# Patient Record
Sex: Male | Born: 1965 | Race: Black or African American | Hispanic: No | Marital: Married | Smoking: Current every day smoker
Health system: Southern US, Community
[De-identification: ages and names within clinical notes are randomized; demographics above are authoritative.]

## PROBLEM LIST (undated history)

## (undated) DIAGNOSIS — E041 Nontoxic single thyroid nodule: Secondary | ICD-10-CM

## (undated) DIAGNOSIS — K219 Gastro-esophageal reflux disease without esophagitis: Secondary | ICD-10-CM

## (undated) HISTORY — PX: CIRCUMCISION: SUR203

## (undated) HISTORY — PX: ESOPHAGOGASTRODUODENOSCOPY: SHX1529

---

## 2008-12-29 ENCOUNTER — Emergency Department (HOSPITAL_BASED_OUTPATIENT_CLINIC_OR_DEPARTMENT_OTHER): Admission: EM | Admit: 2008-12-29 | Discharge: 2008-12-29 | Payer: Self-pay | Admitting: Emergency Medicine

## 2009-05-19 ENCOUNTER — Emergency Department (HOSPITAL_BASED_OUTPATIENT_CLINIC_OR_DEPARTMENT_OTHER): Admission: EM | Admit: 2009-05-19 | Discharge: 2009-05-19 | Payer: Self-pay | Admitting: Emergency Medicine

## 2009-10-21 ENCOUNTER — Emergency Department (HOSPITAL_BASED_OUTPATIENT_CLINIC_OR_DEPARTMENT_OTHER): Admission: EM | Admit: 2009-10-21 | Discharge: 2009-10-21 | Payer: Self-pay | Admitting: Emergency Medicine

## 2010-03-18 ENCOUNTER — Emergency Department (HOSPITAL_BASED_OUTPATIENT_CLINIC_OR_DEPARTMENT_OTHER): Admission: EM | Admit: 2010-03-18 | Discharge: 2010-03-18 | Payer: Self-pay | Admitting: Emergency Medicine

## 2010-03-18 ENCOUNTER — Ambulatory Visit: Payer: Self-pay | Admitting: Diagnostic Radiology

## 2010-08-22 ENCOUNTER — Other Ambulatory Visit: Payer: Self-pay | Admitting: Emergency Medicine

## 2010-08-22 ENCOUNTER — Emergency Department (HOSPITAL_BASED_OUTPATIENT_CLINIC_OR_DEPARTMENT_OTHER)
Admission: EM | Admit: 2010-08-22 | Discharge: 2010-08-22 | Disposition: A | Discharge status: Home / Self Care | Payer: BC Managed Care – PPO | Attending: Emergency Medicine | Admitting: Emergency Medicine

## 2010-08-22 DIAGNOSIS — F17200 Nicotine dependence, unspecified, uncomplicated: Secondary | ICD-10-CM | POA: Insufficient documentation

## 2010-08-22 DIAGNOSIS — R109 Unspecified abdominal pain: Secondary | ICD-10-CM | POA: Insufficient documentation

## 2010-10-09 LAB — URINALYSIS, ROUTINE W REFLEX MICROSCOPIC
Bilirubin Urine: NEGATIVE
Glucose, UA: NEGATIVE mg/dL
Hgb urine dipstick: NEGATIVE
Specific Gravity, Urine: 1.023 (ref 1.005–1.030)
Urobilinogen, UA: 0.2 mg/dL (ref 0.0–1.0)
pH: 6 (ref 5.0–8.0)

## 2011-05-10 ENCOUNTER — Encounter: Payer: Self-pay | Admitting: *Deleted

## 2011-05-10 ENCOUNTER — Emergency Department (HOSPITAL_BASED_OUTPATIENT_CLINIC_OR_DEPARTMENT_OTHER)
Admission: EM | Admit: 2011-05-10 | Discharge: 2011-05-10 | Disposition: A | Discharge status: Home / Self Care | Payer: BC Managed Care – PPO | Attending: Emergency Medicine | Admitting: Emergency Medicine

## 2011-05-10 DIAGNOSIS — J351 Hypertrophy of tonsils: Secondary | ICD-10-CM | POA: Insufficient documentation

## 2011-05-10 DIAGNOSIS — K029 Dental caries, unspecified: Secondary | ICD-10-CM | POA: Insufficient documentation

## 2011-05-10 DIAGNOSIS — J029 Acute pharyngitis, unspecified: Secondary | ICD-10-CM | POA: Insufficient documentation

## 2011-05-10 NOTE — ED Notes (Signed)
Pt has a sore area around his left tonsil x2 days. Pt thinks it may be an abscess.

## 2011-05-10 NOTE — ED Provider Notes (Signed)
Scribed for Hanley Seamen, MD, the patient was seen in room MH05/MH05 . This chart was scribed by Ellie Lunch.   CSN: [id number] Arrival date & time: 05/10/2011 10:02 PM   First MD Initiated Contact with Patient 05/10/11 2306      Chief Complaint  Patient presents with  . Sore Throat    (Consider location/radiation/quality/duration/timing/severity/associated sxs/prior treatment) HPI Timothy Franklin is a 45 y.o. male who presents to the Emergency Department complaining of an bump around his left tonsil area for the past 2 days. Pt reports that he doesn't have any associated pain with the bump. Denies sore throat or fever. There are no other associated symptoms and no other alleviating or aggravating factors.    History reviewed. No pertinent past medical history.  Past Surgical History  Procedure Date  . Circumcision     No family history on file.  History  Substance Use Topics  . Smoking status: Current Everyday Smoker  . Smokeless tobacco: Not on file  . Alcohol Use: Yes    Review of Systems  Constitutional: Negative for fever.  HENT: Negative for sore throat.   All other systems reviewed and are negative.    Allergies  Review of patient's allergies indicates no known allergies.  Home Medications  No current outpatient prescriptions on file.  BP 115/77  Pulse 72  Temp(Src) 98 F (36.7 C) (Oral)  Resp 18  Ht 5\' 8"  (1.727 m)  Wt 197 lb (89.359 kg)  BMI 29.95 kg/m2  SpO2 98%  Physical Exam  Nursing note and vitals reviewed. Constitutional: He is oriented to person, place, and time. He appears well-developed and well-nourished.  HENT:  Head: Normocephalic.  Mouth/Throat: No oropharyngeal exudate, posterior oropharyngeal edema or posterior oropharyngeal erythema.       Swelling on left side of oropharynx behind tonsillar pillar.  Multiple dental carries. Slightly larger left tonsil  Eyes: Conjunctivae and EOM are normal.  Neck: Neck supple.    Cardiovascular: Normal rate and regular rhythm.   Pulmonary/Chest: Effort normal and breath sounds normal.  Abdominal: Soft. There is no tenderness.  Musculoskeletal: Normal range of motion. He exhibits no tenderness.  Lymphadenopathy:    He has no cervical adenopathy.  Neurological: He is alert and oriented to person, place, and time.  Skin: Skin is warm and dry.  Psychiatric: He has a normal mood and affect.   . ED Course  Procedures (including critical care time) OTHER DATA REVIEWED: Nursing notes, vital signs, and past medical records reviewed.   DIAGNOSTIC STUDIES: Oxygen Saturation is 98% on room air, normal by my interpretation.   11:11 PM EDP at PT bedside. Plan to run a strep test. Otherwise plan to discharge.    MDM   Nursing notes and vitals signs, including pulse oximetry, reviewed.  Summary of this visit's results, reviewed by myself:  Labs:  Results for orders placed during the hospital encounter of 05/10/11  RAPID STREP SCREEN      Component Value Range   Streptococcus, Group A Screen (Direct) NEGATIVE  NEGATIVE     Imaging Studies: No results found.  I personally performed the services described in this documentation, which was scribed in my presence.  The recorded information has been reviewed and considered.         Hanley Seamen, MD 05/10/11 [phone]

## 2011-11-02 ENCOUNTER — Emergency Department (HOSPITAL_BASED_OUTPATIENT_CLINIC_OR_DEPARTMENT_OTHER)
Admission: EM | Admit: 2011-11-02 | Discharge: 2011-11-02 | Disposition: A | Discharge status: Home / Self Care | Payer: BC Managed Care – PPO | Attending: Emergency Medicine | Admitting: Emergency Medicine

## 2011-11-02 ENCOUNTER — Encounter (HOSPITAL_BASED_OUTPATIENT_CLINIC_OR_DEPARTMENT_OTHER): Payer: Self-pay | Admitting: *Deleted

## 2011-11-02 DIAGNOSIS — K625 Hemorrhage of anus and rectum: Secondary | ICD-10-CM

## 2011-11-02 DIAGNOSIS — K644 Residual hemorrhoidal skin tags: Secondary | ICD-10-CM | POA: Insufficient documentation

## 2011-11-02 DIAGNOSIS — F17200 Nicotine dependence, unspecified, uncomplicated: Secondary | ICD-10-CM | POA: Insufficient documentation

## 2011-11-02 NOTE — Discharge Instructions (Signed)
Rectal Bleeding  Rectal bleeding is when blood passes out of the anus. It is usually a sign that something is wrong. It may not be serious, but it should always be evaluated. Rectal bleeding may present as bright red blood or extremely dark stools. The color may range from dark red or maroon to black (like tar). It is important that the cause of rectal bleeding be identified so treatment can be started and the problem corrected.  CAUSES    Hemorrhoids. These are enlarged (dilated) blood vessels or veins in the anal or rectal area.   Fistulas. Theseare abnormal, burrowing channels that usually run from inside the rectum to the skin around the anus. They can bleed.   Anal fissures. This is a tear in the tissue of the anus. Bleeding occurs with bowel movements.   Diverticulosis. This is a condition in which pockets or sacs project from the bowel wall. Occasionally, the sacs can bleed.   Diverticulitis. Thisis an infection involving diverticulosis of the colon.   Proctitis and colitis. These are conditions in which the rectum, colon, or both, can become inflamed and pitted (ulcerated).   Polyps and cancer. Polyps are non-cancerous (benign) growths in the colon that may bleed. Certain types of polyps turn into cancer.   Protrusion of the rectum. Part of the rectum can project from the anus and bleed.   Certain medicines.   Intestinal infections.   Blood vessel abnormalities.  HOME CARE INSTRUCTIONS   Eat a high-fiber diet to keep your stool soft.   Limit activity.   Drink enough fluids to keep your urine clear or pale yellow.   Warm baths may be useful to soothe rectal pain.   Follow up with your caregiver as directed.  SEEK IMMEDIATE MEDICAL CARE IF:   You develop increased bleeding.   You have black or dark red stools.   You vomit blood or material that looks like coffee grounds.   You have abdominal pain or tenderness.   You have a fever.   You feel weak, nauseous, or you faint.   You have  severe rectal pain or you are unable to have a bowel movement.  MAKE SURE YOU:   Understand these instructions.   Will watch your condition.   Will get help right away if you are not doing well or get worse.  Document Released: 12/25/2001 Document Revised: 06/24/2011 Document Reviewed: 12/20/2010  ExitCare Patient Information 2012 [employer].

## 2011-11-02 NOTE — ED Provider Notes (Signed)
History     CSN: [id number]  Arrival date & time 11/02/11  2109   First MD Initiated Contact with Patient 11/02/11 2127      Chief Complaint  Patient presents with  . 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  The past few times he has gone to the bathroom, he has noticed blood on the tp.  No rectal pain.  No hard stools.    Patient is a 46 y.o. male presenting with hematochezia. The history is provided by the patient.  Rectal Bleeding  Episode onset: several weeks ago. The problem occurs occasionally. The problem has been unchanged. The patient is experiencing no pain. The stool is described as soft. There was no prior successful therapy. There was no prior unsuccessful therapy.    History reviewed. No pertinent past medical history.  Past Surgical History  Procedure Date  . Circumcision     History reviewed. No pertinent family history.  History  Substance Use Topics  . Smoking status: Current Everyday Smoker  . Smokeless tobacco: Not on file  . Alcohol Use: No      Review of Systems  Gastrointestinal: Positive for hematochezia.  All other systems reviewed and are negative.    Allergies  Review of patient's allergies indicates no known allergies.  Home Medications  No current outpatient prescriptions on file.  BP 106/54  Pulse 66  Temp(Src) 97.9 F (36.6 C) (Oral)  Resp 20  SpO2 98%  Physical Exam  Nursing note and vitals reviewed. Constitutional: He is oriented to person, place, and time. He appears well-developed and well-nourished.  HENT:  Head: Normocephalic and atraumatic.  Neck: Normal range of motion. Neck supple.  Abdominal: Soft. Bowel sounds are normal.  Genitourinary:       The rectum appears normal.  There is a small, non-inflamed hemorrhoid noted.    Neurological: He is alert and oriented to person, place, and  time.  Skin: Skin is warm and dry.    ED Course  Procedures (including critical care time)  Labs Reviewed - No data to display No results found.   No diagnosis found.    MDM  No active bleeding.  Patient advised to not wipe quite so hard, follow up with GI if bleeding continues.          Geoffery Lyons, MD 11/02/11 2144

## 2011-11-02 NOTE — ED Notes (Signed)
Pt states that he had a prostate exam 2 months ago and has had irritation since states that he has been wiping more vigorously since that time this PM pt wiped he noticed blood denies abd pain diarrhea or vomiting denies dizziness

## 2012-06-07 ENCOUNTER — Encounter (HOSPITAL_BASED_OUTPATIENT_CLINIC_OR_DEPARTMENT_OTHER): Payer: Self-pay | Admitting: Emergency Medicine

## 2012-06-07 ENCOUNTER — Emergency Department (HOSPITAL_BASED_OUTPATIENT_CLINIC_OR_DEPARTMENT_OTHER)
Admission: EM | Admit: 2012-06-07 | Discharge: 2012-06-08 | Disposition: A | Discharge status: Home / Self Care | Payer: BC Managed Care – PPO | Attending: Emergency Medicine | Admitting: Emergency Medicine

## 2012-06-07 DIAGNOSIS — F17200 Nicotine dependence, unspecified, uncomplicated: Secondary | ICD-10-CM | POA: Insufficient documentation

## 2012-06-07 DIAGNOSIS — M7020 Olecranon bursitis, unspecified elbow: Secondary | ICD-10-CM

## 2012-06-07 MED ORDER — NAPROXEN SODIUM 550 MG PO TABS: ORAL_TABLET | ORAL | Status: DC

## 2012-06-07 MED ORDER — NAPROXEN 250 MG PO TABS
500.0000 mg | ORAL_TABLET | Freq: Once | ORAL | Status: AC
  Administered 2012-06-08: 500 mg via ORAL
  Filled 2012-06-07: qty 2

## 2012-06-07 MED ORDER — HYDROCODONE-ACETAMINOPHEN 5-325 MG PO TABS: 1.0000 | ORAL_TABLET | Freq: Four times a day (QID) | ORAL | Status: DC | PRN

## 2012-06-07 NOTE — ED Notes (Signed)
Spasms in left arm x1 week.

## 2012-06-07 NOTE — ED Provider Notes (Signed)
History     CSN: [id number]  Arrival date & time 06/07/12  2244   First MD Initiated Contact with Patient 06/07/12 2347      Chief Complaint  Patient presents with  . Arm Pain    (Consider location/radiation/quality/duration/timing/severity/associated sxs/prior treatment) HPI This is a 46 year old male with a 2-3 week history of pain in his left elbow. He describes it as a spasm and it radiates to his triceps. He there was no specific injury that caused it but he does lift weights and does lift boxes at work. There is no significant swelling associated with it. He states he is having difficulty lifting heavier weights with that arm due to the pain. The pain is moderate and worse with palpation or movement.  History reviewed. No pertinent past medical history.  Past Surgical History  Procedure Date  . Circumcision     No family history on file.  History  Substance Use Topics  . Smoking status: Current Every Day Smoker -- 0.5 packs/day  . Smokeless tobacco: Current User  . Alcohol Use: Yes      Review of Systems  All other systems reviewed and are negative.    Allergies  Review of patient's allergies indicates no known allergies.  Home Medications   Current Outpatient Rx  Name  Route  Sig  Dispense  Refill  . TADALAFIL 5 MG PO TABS   Oral   Take 5 mg by mouth daily as needed.           BP 121/68  Pulse 66  Temp 97.9 F (36.6 C) (Oral)  Resp 16  Ht 5\' 8"  (1.727 m)  Wt 203 lb (92.08 kg)  BMI 30.87 kg/m2  SpO2 98%  Physical Exam General: Well-developed, well-nourished male in no acute distress; appearance consistent with age of record HENT: normocephalic, atraumatic Eyes: pupils equal round and reactive to light; extraocular muscles intact Neck: supple Heart: regular rate and rhythm Lungs: clear to auscultation bilaterally Abdomen: soft; nondistended Extremities: No deformity; full range of motion; tenderness of left olecranon bursa without  significant swelling and no warmth Neurologic: Awake, alert and oriented; motor function intact in all extremities and symmetric; no facial droop Skin: Warm and dry Psychiatric: Normal mood and affect    ED Course  Procedures (including critical care time)     MDM          Hanley Seamen, MD 06/07/12 2355

## 2012-07-25 ENCOUNTER — Emergency Department (HOSPITAL_BASED_OUTPATIENT_CLINIC_OR_DEPARTMENT_OTHER)
Admission: EM | Admit: 2012-07-25 | Discharge: 2012-07-25 | Discharge status: Against Medical Advice | Payer: BC Managed Care – PPO | Attending: Emergency Medicine | Admitting: Emergency Medicine

## 2012-07-25 ENCOUNTER — Encounter (HOSPITAL_BASED_OUTPATIENT_CLINIC_OR_DEPARTMENT_OTHER): Payer: Self-pay | Admitting: *Deleted

## 2012-07-25 DIAGNOSIS — F17200 Nicotine dependence, unspecified, uncomplicated: Secondary | ICD-10-CM | POA: Insufficient documentation

## 2012-07-25 DIAGNOSIS — M79609 Pain in unspecified limb: Secondary | ICD-10-CM | POA: Insufficient documentation

## 2012-07-25 NOTE — ED Notes (Signed)
Pt c/o pain in his left 5th toe. Pt sts it has a callous on it.

## 2012-07-26 ENCOUNTER — Encounter (HOSPITAL_BASED_OUTPATIENT_CLINIC_OR_DEPARTMENT_OTHER): Payer: Self-pay | Admitting: *Deleted

## 2012-07-26 ENCOUNTER — Emergency Department (HOSPITAL_BASED_OUTPATIENT_CLINIC_OR_DEPARTMENT_OTHER)
Admission: EM | Admit: 2012-07-26 | Discharge: 2012-07-27 | Disposition: A | Discharge status: Home / Self Care | Payer: BC Managed Care – PPO | Attending: Emergency Medicine | Admitting: Emergency Medicine

## 2012-07-26 DIAGNOSIS — F17200 Nicotine dependence, unspecified, uncomplicated: Secondary | ICD-10-CM | POA: Insufficient documentation

## 2012-07-26 DIAGNOSIS — B353 Tinea pedis: Secondary | ICD-10-CM | POA: Insufficient documentation

## 2012-07-26 DIAGNOSIS — Z79899 Other long term (current) drug therapy: Secondary | ICD-10-CM | POA: Insufficient documentation

## 2012-07-26 NOTE — ED Notes (Signed)
Pt c/o left foot pain x 1 month.

## 2012-07-26 NOTE — ED Notes (Signed)
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

## 2012-07-27 NOTE — ED Provider Notes (Addendum)
History     CSN: [id number]  Arrival date & time 07/26/12  2205   First MD Initiated Contact with Patient 07/27/12 0024      Chief Complaint  Patient presents with  . Foot Pain    (Consider location/radiation/quality/duration/timing/severity/associated sxs/prior treatment) Patient is a 47 y.o. male presenting with lower extremity pain. The history is provided by the patient.  Foot Pain This is a new problem. Episode onset: one month ago. The problem occurs constantly. The problem has been gradually worsening. Nothing aggravates the symptoms. Nothing relieves the symptoms. Treatments tried: Gold bond cream. The treatment provided no relief.    History reviewed. No pertinent past medical history.  Past Surgical History  Procedure Date  . Circumcision     History reviewed. No pertinent family history.  History  Substance Use Topics  . Smoking status: Current Every Day Smoker -- 0.5 packs/day  . Smokeless tobacco: Current User  . Alcohol Use: Yes      Review of Systems  All other systems reviewed and are negative.    Allergies  Review of patient's allergies indicates no known allergies.  Home Medications   Current Outpatient Rx  Name  Route  Sig  Dispense  Refill  . HYDROCODONE-ACETAMINOPHEN 5-325 MG PO TABS   Oral   Take 1-2 tablets by mouth every 6 (six) hours as needed for pain.   20 tablet   0   . NAPROXEN SODIUM 550 MG PO TABS      Take 1 tablet every 12 hours for left elbow pain. Best taken with a meal.   60 tablet   0   . TADALAFIL 5 MG PO TABS   Oral   Take 5 mg by mouth daily as needed.           BP 128/78  Pulse 64  Temp 98.6 F (37 C) (Oral)  Resp 16  Ht 5\' 8"  (1.727 m)  Wt 201 lb (91.173 kg)  BMI 30.56 kg/m2  SpO2 100%  Physical Exam  Nursing note and vitals reviewed. Constitutional: He is oriented to person, place, and time. He appears well-developed and well-nourished.  HENT:  Head: Normocephalic and atraumatic.  Neck:  Normal range of motion. Neck supple.  Neurological: He is alert and oriented to person, place, and time.  Skin: Skin is warm and dry.       There is a callus and swelling to the area between the left 4th and 5th toes.  There is no erythema extending into the foot or ankle.      ED Course  Procedures (including critical care time)  Labs Reviewed - No data to display No results found.   1. Tinea pedis       MDM  This appears to be a bad case of tinea pedis.  I will recommend an antifungal foot powder.  I have also instructed him to allow the area to dry as much as possible so a to take away the moisture the fungus needs to survive.  Return prn.          Geoffery Lyons, MD 07/27/12 4098  Geoffery Lyons, MD 07/27/12 1191  Geoffery Lyons, MD 07/27/12 4782

## 2013-04-03 ENCOUNTER — Encounter (HOSPITAL_BASED_OUTPATIENT_CLINIC_OR_DEPARTMENT_OTHER): Payer: Self-pay | Admitting: *Deleted

## 2013-04-03 ENCOUNTER — Emergency Department (HOSPITAL_BASED_OUTPATIENT_CLINIC_OR_DEPARTMENT_OTHER)
Admission: EM | Admit: 2013-04-03 | Discharge: 2013-04-03 | Disposition: A | Discharge status: Home / Self Care | Payer: BC Managed Care – PPO | Attending: Emergency Medicine | Admitting: Emergency Medicine

## 2013-04-03 DIAGNOSIS — Z791 Long term (current) use of non-steroidal anti-inflammatories (NSAID): Secondary | ICD-10-CM | POA: Insufficient documentation

## 2013-04-03 DIAGNOSIS — L299 Pruritus, unspecified: Secondary | ICD-10-CM | POA: Insufficient documentation

## 2013-04-03 DIAGNOSIS — Z79899 Other long term (current) drug therapy: Secondary | ICD-10-CM | POA: Insufficient documentation

## 2013-04-03 DIAGNOSIS — L509 Urticaria, unspecified: Secondary | ICD-10-CM | POA: Insufficient documentation

## 2013-04-03 DIAGNOSIS — F17200 Nicotine dependence, unspecified, uncomplicated: Secondary | ICD-10-CM | POA: Insufficient documentation

## 2013-04-03 NOTE — ED Notes (Signed)
Hives after getting off work. Symptoms have been on going all week. He is a Loss adjuster, chartered. No resp distress.

## 2013-04-03 NOTE — ED Notes (Signed)
MD at bedside. 

## 2013-04-03 NOTE — ED Provider Notes (Signed)
CSN: [id number]     Arrival date & time 04/03/13  2055 History  This chart was scribed for Shanna Cisco, MD by Clydene Laming, ED Scribe. This patient was seen in room MH01/MH01 and the patient's care was started at 10:50 PM.   Chief Complaint  Patient presents with  . Allergic Reaction    Patient is a 47 y.o. male presenting with allergic reaction. The history is provided by the patient. No language interpreter was used.  Allergic Reaction Presenting symptoms: itching and rash   Presenting symptoms: no difficulty breathing, no difficulty swallowing, no swelling and no wheezing   Itching:    Location: R inner thigh, waist.   Severity:  Moderate   Onset quality:  Sudden   Duration:  1 day   Timing:  Constant   Progression:  Unchanged Rash:    Location: R medial thigh, waistline.   Quality: itchiness     Severity:  Moderate   Onset quality:  Unable to specify   Duration:  1 day   Timing:  Constant   Progression:  Unchanged Severity:  Moderate Prior allergic episodes:  No prior episodes Context: new detergents/soaps   Relieved by:  Nothing Worsened by:  Nothing tried Ineffective treatments:  None tried   HPI Comments: Rayman Petrosian is a 47 y.o. male who presents to the Emergency Department complaining of an itching rash on the right upper thigh and waistline. 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 Pt denies using any new soaps or lotions. Pt denies nausea, emesis, fever, or chills. Pt denies using any medications to treat rashes on the skin. He recently started using new soap.  History reviewed. No pertinent past medical history. Past Surgical History  Procedure Laterality Date  . Circumcision     No family history on file. History  Substance Use Topics  . Smoking status: Current Every Day Smoker -- 0.50 packs/day    Types: Cigarettes  . Smokeless tobacco: Current User  . Alcohol Use: Yes    Review of Systems  Constitutional: Negative for  fever, activity change, appetite change and fatigue.  HENT: Negative for congestion, facial swelling, rhinorrhea and trouble swallowing.   Eyes: Negative for photophobia and pain.  Respiratory: Negative for cough, chest tightness, shortness of breath and wheezing.   Cardiovascular: Negative for chest pain and leg swelling.  Gastrointestinal: Negative for nausea, vomiting, abdominal pain, diarrhea and constipation.  Endocrine: Negative for polydipsia and polyuria.  Genitourinary: Negative for dysuria, urgency, decreased urine volume and difficulty urinating.  Musculoskeletal: Negative for back pain and gait problem.  Skin: Positive for itching and rash. Negative for color change and wound.  Allergic/Immunologic: Negative for immunocompromised state.  Neurological: Negative for dizziness, facial asymmetry, speech difficulty, weakness, numbness and headaches.  Psychiatric/Behavioral: Negative for confusion, decreased concentration and agitation.  All other systems reviewed and are negative.    Allergies  Review of patient's allergies indicates no known allergies.  Home Medications   Current Outpatient Rx  Name  Route  Sig  Dispense  Refill  . HYDROcodone-acetaminophen (NORCO/VICODIN) 5-325 MG per tablet   Oral   Take 1-2 tablets by mouth every 6 (six) hours as needed for pain.   20 tablet   0   . naproxen sodium (ANAPROX DS) 550 MG tablet      Take 1 tablet every 12 hours for left elbow pain. Best taken with a meal.   60 tablet   0   . tadalafil (CIALIS)  5 MG tablet   Oral   Take 5 mg by mouth daily as needed.          Triage Vitals: BP 90/60  Pulse 73  Temp(Src) 98.2 F (36.8 C) (Oral)  Resp 20  Ht 5\' 8"  (1.727 m)  Wt 201 lb (91.173 kg)  BMI 30.57 kg/m2  SpO2 100% Physical Exam  Nursing note and vitals reviewed. Constitutional: He is oriented to person, place, and time. He appears well-developed and well-nourished. No distress.  HENT:  Head: Normocephalic and  atraumatic.  Mouth/Throat: Oropharynx is clear and moist. No oropharyngeal exudate.  Eyes: EOM are normal. Pupils are equal, round, and reactive to light.  Neck: Normal range of motion. Neck supple.  Cardiovascular: Normal rate, regular rhythm and normal heart sounds.  Exam reveals no gallop and no friction rub.   No murmur heard. Pulmonary/Chest: Effort normal and breath sounds normal. No respiratory distress. He has no wheezes. He has no rales.  Abdominal: Soft. Bowel sounds are normal. He exhibits no distension and no mass. There is no tenderness. There is no rebound and no guarding.  Musculoskeletal: Normal range of motion. He exhibits no edema and no tenderness.  Neurological: He is alert and oriented to person, place, and time.  Skin: Skin is warm and dry. Rash noted. Rash is urticarial.     Urticaria of right medial thigh, inguinal areas as circled  Psychiatric: He has a normal mood and affect. His behavior is normal.    ED Course  Procedures (including critical care time)  DIAGNOSTIC STUDIES: Oxygen Saturation is 100% on RA, normal by my interpretation.    COORDINATION OF CARE: 10:58 PM- Discussed treatment plan with pt at bedside. Pt verbalized understanding and agreement with plan.   Labs Review Labs Reviewed - No data to display Imaging Review No results found.  MDM  No diagnosis found. Pt is a 47 y.o. male with Pmhx as above who presents with 1 day of urticarial rash w/o s/sx of anaphylaxis.  Pt stared using new soap which may be culprit.  VSS, pt in NAD.  PE otherwise benign except as above.  Have recommended benadryl, switching to soap/detergent that is dye & scent free.  Return precautions given for new or worsening symptoms including s/sx of anaphylaxis.   1. Urticaria     I personally performed the services described in this documentation, which was scribed in my presence. The recorded information has been reviewed and is accurate.   Shanna Cisco,  MD 04/04/13 1259

## 2013-04-19 ENCOUNTER — Encounter (HOSPITAL_BASED_OUTPATIENT_CLINIC_OR_DEPARTMENT_OTHER): Payer: Self-pay | Admitting: *Deleted

## 2013-04-19 ENCOUNTER — Emergency Department (HOSPITAL_BASED_OUTPATIENT_CLINIC_OR_DEPARTMENT_OTHER)
Admission: EM | Admit: 2013-04-19 | Discharge: 2013-04-19 | Disposition: A | Discharge status: Home / Self Care | Payer: BC Managed Care – PPO | Attending: Emergency Medicine | Admitting: Emergency Medicine

## 2013-04-19 DIAGNOSIS — M542 Cervicalgia: Secondary | ICD-10-CM | POA: Insufficient documentation

## 2013-04-19 DIAGNOSIS — J029 Acute pharyngitis, unspecified: Secondary | ICD-10-CM

## 2013-04-19 DIAGNOSIS — F17200 Nicotine dependence, unspecified, uncomplicated: Secondary | ICD-10-CM | POA: Insufficient documentation

## 2013-04-19 DIAGNOSIS — Z79899 Other long term (current) drug therapy: Secondary | ICD-10-CM | POA: Insufficient documentation

## 2013-04-19 LAB — RAPID STREP SCREEN (MED CTR MEBANE ONLY): Streptococcus, Group A Screen (Direct): NEGATIVE

## 2013-04-19 MED ORDER — DEXAMETHASONE 1 MG/ML PO CONC
10.0000 mg | Freq: Once | ORAL | Status: AC
  Administered 2013-04-19: 10 mg via ORAL
  Filled 2013-04-19: qty 1

## 2013-04-19 MED ORDER — DEXAMETHASONE 1 MG/ML PO CONC
ORAL | Status: AC
  Filled 2013-04-19: qty 9

## 2013-04-19 MED ORDER — PENICILLIN V POTASSIUM 250 MG PO TABS
500.0000 mg | ORAL_TABLET | Freq: Once | ORAL | Status: AC
  Administered 2013-04-19: 500 mg via ORAL
  Filled 2013-04-19: qty 2

## 2013-04-19 MED ORDER — PENICILLIN V POTASSIUM 500 MG PO TABS: 500.0000 mg | ORAL_TABLET | Freq: Four times a day (QID) | ORAL | Status: AC

## 2013-04-19 MED ORDER — KETOROLAC TROMETHAMINE 30 MG/ML IJ SOLN
30.0000 mg | Freq: Once | INTRAMUSCULAR | Status: AC
  Administered 2013-04-19: 30 mg via INTRAMUSCULAR
  Filled 2013-04-19: qty 1

## 2013-04-19 NOTE — ED Provider Notes (Signed)
CSN: [id number]     Arrival date & time 04/19/13  1900 History  This chart was scribed for Gerhard Munch, MD by Dorothey Baseman, ED Scribe. This patient was seen in room MH11/MH11 and the patient's care was started at 7:34 PM.    Chief Complaint  Patient presents with  . Sore Throat   The history is provided by the patient. No language interpreter was used.   HPI Comments: Timothy Franklin is a 47 y.o. male who presents to the Emergency Department complaining of intermittent sore throat onset 1-2 weeks ago. He reports that the sore throat presented after he was seen here 2 weeks ago for an urticarial rash that was treated with Benadryl. He reports associated neck pain with range of motion. He reports that the soreness is relieved with warm liquids. He denies fever, nausea, emesis, cough, difficult or painful swallowing, rash, abdominal pain, diarrhea. Patient denies any pertinent medical history.   History reviewed. No pertinent past medical history. Past Surgical History  Procedure Laterality Date  . Circumcision     History reviewed. No pertinent family history. History  Substance Use Topics  . Smoking status: Current Every Day Smoker -- 0.50 packs/day    Types: Cigarettes  . Smokeless tobacco: Current User  . Alcohol Use: Yes    Review of Systems  Constitutional: Negative for fever.  HENT: Positive for sore throat and neck pain. Negative for trouble swallowing.   Respiratory: Negative for cough.   Gastrointestinal: Negative for nausea, vomiting, abdominal pain and diarrhea.  Skin: Negative for rash.  All other systems reviewed and are negative.    Allergies  Review of patient's allergies indicates no known allergies.  Home Medications   Current Outpatient Rx  Name  Route  Sig  Dispense  Refill  . HYDROcodone-acetaminophen (NORCO/VICODIN) 5-325 MG per tablet   Oral   Take 1-2 tablets by mouth every 6 (six) hours as needed for pain.   20 tablet   0   . naproxen sodium  (ANAPROX DS) 550 MG tablet      Take 1 tablet every 12 hours for left elbow pain. Best taken with a meal.   60 tablet   0   . tadalafil (CIALIS) 5 MG tablet   Oral   Take 5 mg by mouth daily as needed.          Triage Vitals: BP 110/86  Pulse 66  Temp(Src) 98.3 F (36.8 C) (Oral)  Resp 16  Ht 5\' 8"  (1.727 m)  Wt 201 lb (91.173 kg)  BMI 30.57 kg/m2  SpO2 97%  Physical Exam  Nursing note and vitals reviewed. Constitutional: He is oriented to person, place, and time. He appears well-developed and well-nourished. No distress.  HENT:  Head: Normocephalic and atraumatic.  Mouth/Throat: Oropharyngeal exudate present.  Exudative pharyngitis.   Eyes: Conjunctivae are normal.  Neck: Normal range of motion. Neck supple.  Large lymph node on the left. Tenderness to palpation that is anterior to the SEM on the left without any superficial changes.  Pulmonary/Chest: Effort normal and breath sounds normal. No respiratory distress.  Musculoskeletal: Normal range of motion.  Lymphadenopathy:    He has no cervical adenopathy.  Neurological: He is alert and oriented to person, place, and time.  Skin: Skin is warm and dry.  Psychiatric: He has a normal mood and affect. His behavior is normal.    ED Course  Procedures (including critical care time)  Medications  dexamethasone (DECADRON) 1 MG/ML solution (not administered)  ketorolac (TORADOL) 30 MG/ML injection 30 mg (30 mg Intramuscular Given 04/19/13 1953)  dexamethasone (DECADRON) 1 MG/ML solution 10 mg (10 mg Oral Given 04/19/13 1952)    DIAGNOSTIC STUDIES: Oxygen Saturation is 97% on room air, normal by my interpretation.    COORDINATION OF CARE: 7:39PM- Discussed that symptoms are likely due to pharyngitis, but will order a strep test. Will order Decadron and Toradol to manage symptoms. Discussed treatment plan with patient at bedside and patient verbalized agreement.   8:22PM- Discussed negative strep test results, but that  the patient will be treated for an infection. Will discharge patient with antibiotics. Advised patient to follow up with ENT if symptoms due not improve in 3 days or if there are any new or worsening symptoms. Discussed treatment plan with patient at bedside and patient verbalized agreement.    Labs Review Labs Reviewed  RAPID STREP SCREEN   Imaging Review No results found.  MDM  No diagnosis found.  I personally performed the services described in this documentation, which was scribed in my presence. The recorded information has been reviewed and is accurate.   This patient presents with ongoing pharyngitis.  On exam he is adenopathy, exudative pharyngitis, and given his physical exam findings, he was treated empirically for strep throat, while confirmatory testing was pending.  Gerhard Munch, MD 04/19/13 2029

## 2013-04-19 NOTE — ED Notes (Signed)
Pt c/o sore throat x 2 weeks

## 2013-04-21 ENCOUNTER — Emergency Department (HOSPITAL_BASED_OUTPATIENT_CLINIC_OR_DEPARTMENT_OTHER)
Admission: EM | Admit: 2013-04-21 | Discharge: 2013-04-21 | Disposition: A | Discharge status: Home / Self Care | Payer: BC Managed Care – PPO | Attending: Emergency Medicine | Admitting: Emergency Medicine

## 2013-04-21 ENCOUNTER — Encounter (HOSPITAL_BASED_OUTPATIENT_CLINIC_OR_DEPARTMENT_OTHER): Payer: Self-pay

## 2013-04-21 ENCOUNTER — Emergency Department (HOSPITAL_BASED_OUTPATIENT_CLINIC_OR_DEPARTMENT_OTHER): Payer: BC Managed Care – PPO

## 2013-04-21 DIAGNOSIS — F17200 Nicotine dependence, unspecified, uncomplicated: Secondary | ICD-10-CM | POA: Insufficient documentation

## 2013-04-21 DIAGNOSIS — Z792 Long term (current) use of antibiotics: Secondary | ICD-10-CM | POA: Insufficient documentation

## 2013-04-21 DIAGNOSIS — R0789 Other chest pain: Secondary | ICD-10-CM

## 2013-04-21 DIAGNOSIS — X500XXA Overexertion from strenuous movement or load, initial encounter: Secondary | ICD-10-CM | POA: Insufficient documentation

## 2013-04-21 DIAGNOSIS — Y9389 Activity, other specified: Secondary | ICD-10-CM | POA: Insufficient documentation

## 2013-04-21 DIAGNOSIS — S298XXA Other specified injuries of thorax, initial encounter: Secondary | ICD-10-CM | POA: Insufficient documentation

## 2013-04-21 DIAGNOSIS — Y929 Unspecified place or not applicable: Secondary | ICD-10-CM | POA: Insufficient documentation

## 2013-04-21 MED ORDER — HYDROCODONE-ACETAMINOPHEN 5-325 MG PO TABS: 1.0000 | ORAL_TABLET | ORAL | Status: DC | PRN

## 2013-04-21 MED ORDER — NAPROXEN 500 MG PO TABS: 500.0000 mg | ORAL_TABLET | Freq: Two times a day (BID) | ORAL | Status: DC

## 2013-04-21 NOTE — ED Notes (Signed)
Patient here with sternum pain, reports that he did a lot of weight lifting and also lifted his son and then developed this pain, painful to touch

## 2013-04-21 NOTE — ED Notes (Signed)
Patient states that he is having mid sternal chest pain that started last night after he picked up his son who has CP. Patient states that he placed a heat pack on it and it felt better.

## 2013-04-21 NOTE — ED Provider Notes (Signed)
Medical screening examination/treatment/procedure(s) were performed by non-physician practitioner and as supervising physician I was immediately available for consultation/collaboration.  Doug Sou, MD 04/21/13 1731

## 2013-04-21 NOTE — ED Provider Notes (Signed)
CSN: [id number]     Arrival date & time 04/21/13  1238 History   First MD Initiated Contact with Patient 04/21/13 1448     Chief Complaint  Patient presents with  . Chest Injury   (Consider location/radiation/quality/duration/timing/severity/associated sxs/prior Treatment) The history is provided by the patient.  Timothy Franklin is a 47 y.o. male who presents to the ED with pain in the sternum after lifting his son last night. He has also been doing weight lifting. The onset was sudden. He applied heat last night without relief. The pain increases with pressure, movement or taking a deep breath. He denies nausea, shortness of breath or other problems.   History reviewed. No pertinent past medical history. Past Surgical History  Procedure Laterality Date  . Circumcision     No family history on file. History  Substance Use Topics  . Smoking status: Current Every Day Smoker -- 0.50 packs/day    Types: Cigarettes  . Smokeless tobacco: Current User  . Alcohol Use: Yes    Review of Systems  Constitutional: Negative for fever and chills.  HENT: Negative for neck pain.   Respiratory: Negative for chest tightness and shortness of breath.   Cardiovascular: Positive for chest pain (with deep breath and movement.). Negative for palpitations and leg swelling.  Gastrointestinal: Negative for nausea and vomiting.  Musculoskeletal: Negative for back pain.       Chest wall pain  Skin: Negative for rash.  Allergic/Immunologic: Negative for immunocompromised state.  Neurological: Negative for dizziness, light-headedness and headaches.  Psychiatric/Behavioral: The patient is not nervous/anxious.     Allergies  Review of patient's allergies indicates no known allergies.  Home Medications   Current Outpatient Rx  Name  Route  Sig  Dispense  Refill  . HYDROcodone-acetaminophen (NORCO/VICODIN) 5-325 MG per tablet   Oral   Take 1-2 tablets by mouth every 6 (six) hours as needed for pain.  20 tablet   0   . naproxen sodium (ANAPROX DS) 550 MG tablet      Take 1 tablet every 12 hours for left elbow pain. Best taken with a meal.   60 tablet   0   . penicillin v potassium (VEETID) 500 MG tablet   Oral   Take 1 tablet (500 mg total) by mouth 4 (four) times daily.   40 tablet   0   . tadalafil (CIALIS) 5 MG tablet   Oral   Take 5 mg by mouth daily as needed.          BP 129/74  Pulse 57  Temp(Src) 98.2 F (36.8 C) (Oral)  Resp 18  SpO2 100% Physical Exam  Nursing note and vitals reviewed. Constitutional: He is oriented to person, place, and time. He appears well-developed and well-nourished. No distress.  HENT:  Head: Normocephalic and atraumatic.  Eyes: Conjunctivae and EOM are normal.  Neck: Normal range of motion. Neck supple.  Cardiovascular: Normal rate, regular rhythm and normal heart sounds.   Pulmonary/Chest: Effort normal and breath sounds normal. He exhibits tenderness.    Chest wall tenderness with palpation and movement.   Abdominal: Soft. There is no tenderness.  Musculoskeletal: Normal range of motion.  Neurological: He is alert and oriented to person, place, and time. No cranial nerve deficit.  Skin: Skin is warm and dry.  Psychiatric: He has a normal mood and affect. His behavior is normal. Judgment and thought content normal.    ED Course  Procedures  Dg Sternum  04/21/2013   CLINICAL  DATA:  Chest injury after working out.  EXAM: STERNUM - 2+ VIEW  COMPARISON:  The thoracic spine 03/18/2010  FINDINGS: Lungs are adequately inflated without focal consolidation or effusion. There is mild cardiomegaly. There is no evidence of sternal fracture.  IMPRESSION: No acute findings.   Electronically Signed   By: Elberta Fortis M.D.   On: 04/21/2013 15:25    MDM  47 y.o. male with chest wall pain after picking up his son and also doing weight lifting. Will treat for pain and inflammation. He is stable for discharge home without any immediate  complications. No cardiac symptoms.  I have reviewed this patient's vital signs, nurses notes, appropriate labs and imaging.  I have discussed findings with the patient and plan of care and he voices understanding.    Medication List    STOP taking these medications       naproxen sodium 550 MG tablet  Commonly known as:  ANAPROX DS      TAKE these medications       HYDROcodone-acetaminophen 5-325 MG per tablet  Commonly known as:  NORCO/VICODIN  Take 1 tablet by mouth every 4 (four) hours as needed for pain.     naproxen 500 MG tablet  Commonly known as:  NAPROSYN  Take 1 tablet (500 mg total) by mouth 2 (two) times daily.      ASK your doctor about these medications       penicillin v potassium 500 MG tablet  Commonly known as:  VEETID  Take 1 tablet (500 mg total) by mouth 4 (four) times daily.     tadalafil 5 MG tablet  Commonly known as:  CIALIS  Take 5 mg by mouth daily as needed.           [facility] Orlene Och, NP 04/21/13 1723

## 2013-04-22 LAB — CULTURE, GROUP A STREP

## 2013-08-08 ENCOUNTER — Encounter (HOSPITAL_BASED_OUTPATIENT_CLINIC_OR_DEPARTMENT_OTHER): Payer: Self-pay | Admitting: Emergency Medicine

## 2013-08-08 ENCOUNTER — Emergency Department (HOSPITAL_BASED_OUTPATIENT_CLINIC_OR_DEPARTMENT_OTHER)
Admission: EM | Admit: 2013-08-08 | Discharge: 2013-08-08 | Disposition: A | Discharge status: Home / Self Care | Payer: BC Managed Care – PPO | Attending: Emergency Medicine | Admitting: Emergency Medicine

## 2013-08-08 DIAGNOSIS — Z791 Long term (current) use of non-steroidal anti-inflammatories (NSAID): Secondary | ICD-10-CM | POA: Insufficient documentation

## 2013-08-08 DIAGNOSIS — F17200 Nicotine dependence, unspecified, uncomplicated: Secondary | ICD-10-CM | POA: Insufficient documentation

## 2013-08-08 DIAGNOSIS — M25539 Pain in unspecified wrist: Secondary | ICD-10-CM | POA: Insufficient documentation

## 2013-08-08 DIAGNOSIS — M62838 Other muscle spasm: Secondary | ICD-10-CM | POA: Insufficient documentation

## 2013-08-08 DIAGNOSIS — R209 Unspecified disturbances of skin sensation: Secondary | ICD-10-CM | POA: Insufficient documentation

## 2013-08-08 MED ORDER — IBUPROFEN 800 MG PO TABS: 800.0000 mg | ORAL_TABLET | Freq: Three times a day (TID) | ORAL | Status: DC

## 2013-08-08 NOTE — ED Notes (Signed)
MD at bedside. 

## 2013-08-08 NOTE — ED Notes (Signed)
Pt c/o left arm pain that starts  at bicep with left finger numbness x 1 week

## 2013-08-08 NOTE — ED Provider Notes (Signed)
CSN: [id number]     Arrival date & time 08/08/13  1926 History  This chart was scribed for Dagmar Hait, MD by Quintella Reichert, ED scribe.  This patient was seen in room MH10/MH10 and the patient's care was started at 9:54 PM.   Chief Complaint  Patient presents with  . Arm Pain    Patient is a 48 y.o. male presenting with arm pain. The history is provided by the patient. No language interpreter was used.  Arm Pain This is a new problem. The current episode started more than 1 week ago. The problem occurs daily. Pertinent negatives include no chest pain and no shortness of breath. Exacerbated by: using the arm, lifting. The symptoms are relieved by rest.    HPI Comments: Roxanne Orner is a 48 y.o. male with no chronic medical conditions who presents to the Emergency Department complaining of intermittent left arm pain that began 2 weeks ago.  Pt describes pain as a "spasm" sensation that extends from the bicep down into his forearm.  He states pain has been coming and going since onset, and is worse when he is using the arm at work and is better when he lies down in bed at the night.  He also complains of intermittent "tingling" in his left thumb and index finger.  He also notes some pain in his elbow due to tendinitis but denies recent changes.  Pt lifts frequently for work ([employer]) and at [employer]   He notes that while he was at work today one of his friends died suddenly of a MI so he came to the ED to get checked out.  He denies CP, SOB, diaphoresis, dizziness, nausea, or any other associated symptoms.  He is right-handed.   History reviewed. No pertinent past medical history.   Past Surgical History  Procedure Laterality Date  . Circumcision      History reviewed. No pertinent family history.   History  Substance Use Topics  . Smoking status: Current Every Day Smoker -- 0.50 packs/day    Types: Cigarettes  . Smokeless tobacco: Current User  . Alcohol Use: Yes      Review of Systems  Constitutional: Negative for diaphoresis.  Respiratory: Negative for shortness of breath.   Cardiovascular: Negative for chest pain.  Gastrointestinal: Negative for nausea.  Musculoskeletal: Positive for myalgias (left arm).  Neurological: Positive for numbness (tingling in left thumb and index finger). Negative for dizziness.  All other systems reviewed and are negative.     Allergies  Review of patient's allergies indicates no known allergies.  Home Medications   Current Outpatient Rx  Name  Route  Sig  Dispense  Refill  . HYDROcodone-acetaminophen (NORCO/VICODIN) 5-325 MG per tablet   Oral   Take 1 tablet by mouth every 4 (four) hours as needed for pain.   20 tablet   0   . naproxen (NAPROSYN) 500 MG tablet   Oral   Take 1 tablet (500 mg total) by mouth 2 (two) times daily.   20 tablet   0   . tadalafil (CIALIS) 5 MG tablet   Oral   Take 5 mg by mouth daily as needed.          BP 136/54  Pulse 58  Temp(Src) 98.8 F (37.1 C) (Oral)  Resp 16  Wt 201 lb (91.173 kg)  SpO2 97%  Physical Exam  Nursing note and vitals reviewed. Constitutional: He is oriented to person, place, and time. He appears well-developed  and well-nourished. No distress.  HENT:  Head: Normocephalic and atraumatic.  Eyes: EOM are normal.  Neck: Neck supple. No tracheal deviation present.  Cardiovascular: Normal rate.   Pulmonary/Chest: Effort normal. No respiratory distress.  Musculoskeletal: Normal range of motion. He exhibits no tenderness.  Neurological: He is alert and oriented to person, place, and time.  Skin: Skin is warm and dry.  Psychiatric: He has a normal mood and affect. His behavior is normal.    ED Course  Procedures (including critical care time)  DIAGNOSTIC STUDIES: Oxygen Saturation is 97% on room air, normal by my interpretation.    COORDINATION OF CARE: 10:00 PM-Informed pt that symptoms are most likely muscular.  Discussed treatment  plan which includes ibuprofen with pt at bedside and pt agreed to plan.    Labs Review Labs Reviewed - No data to display  Imaging Review No results found.  EKG Interpretation    Date/Time:  Wednesday August 08 2013 21:56:06 EST Ventricular Rate:  52 PR Interval:  174 QRS Duration: 102 QT Interval:  422 QTC Calculation: 392 R Axis:   49 Text Interpretation:  Sinus bradycardia Otherwise normal ECG No prior Confirmed by Gwendolyn GrantWALDEN  MD, Nana Hoselton ([id number]) on 08/09/2013 12:03:15 AM            MDM   1. Muscle spasm    48 year old male presents with left arm pain. Describes his is and and is forearm. He has occasional tingling in his left thumb and left index finger. This happening intermittently over the past 2 weeks. Worse when he is working out and curling. 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 Patient was concerned because his friend at work last heart attack earlier today and time. He denied any chest pain, shortness of breath. Patient just wanted to get this checked out. Patient's EKG here shows sinus bradycardia with no acute ischemic changes. His story is not consistent with coronary artery disease. He story is consistent with muscle spasm from overuse. Patient given Motrin instructed to relax and rest his arm. He is stable for discharge. l  I personally performed the services described in this documentation, which was scribed in my presence. The recorded information has been reviewed and is accurate.     Dagmar HaitWilliam Arash Karstens, MD 08/09/13 [id number]

## 2013-08-08 NOTE — Discharge Instructions (Signed)
Muscle Cramps and Spasms  Muscle cramps and spasms are when muscles tighten by themselves. They usually get better within minutes. Muscle cramps are painful. They are usually stronger and last longer than muscle spasms. Muscle spasms may or may not be painful. They can last a few seconds or much longer.  HOME CARE  · Drink enough fluid to keep your pee (urine) clear or pale yellow.  · Massage, stretch, and relax the muscle.  · Use a warm towel, heating pad, or warm shower water on tight muscles.  · Place ice on the muscle if it is tender or in pain.  · Put ice in a plastic bag.  · Place a towel between your skin and the bag.  · Leave the ice on for 15-20 minutes, 03-04 times a day.  · Only take medicine as told by your doctor.  GET HELP RIGHT AWAY IF:   Your cramps or spasms get worse, happen more often, or do not get better with time.  MAKE SURE YOU:  · Understand these instructions.  · Will watch your condition.  · Will get help right away if you are not doing well or get worse.  Document Released: 06/17/2008 Document Revised: 10/30/2012 Document Reviewed: 06/21/2012  [employer]® Patient Information ©2014 [employer].

## 2013-10-07 ENCOUNTER — Emergency Department (HOSPITAL_BASED_OUTPATIENT_CLINIC_OR_DEPARTMENT_OTHER)
Admission: EM | Admit: 2013-10-07 | Discharge: 2013-10-07 | Disposition: A | Discharge status: Home / Self Care | Payer: BC Managed Care – PPO | Attending: Emergency Medicine | Admitting: Emergency Medicine

## 2013-10-07 ENCOUNTER — Encounter (HOSPITAL_BASED_OUTPATIENT_CLINIC_OR_DEPARTMENT_OTHER): Payer: Self-pay | Admitting: Emergency Medicine

## 2013-10-07 DIAGNOSIS — J358 Other chronic diseases of tonsils and adenoids: Secondary | ICD-10-CM | POA: Insufficient documentation

## 2013-10-07 DIAGNOSIS — F17200 Nicotine dependence, unspecified, uncomplicated: Secondary | ICD-10-CM | POA: Insufficient documentation

## 2013-10-07 DIAGNOSIS — R220 Localized swelling, mass and lump, head: Secondary | ICD-10-CM

## 2013-10-07 DIAGNOSIS — Z791 Long term (current) use of non-steroidal anti-inflammatories (NSAID): Secondary | ICD-10-CM | POA: Insufficient documentation

## 2013-10-07 NOTE — ED Notes (Signed)
Pt noticed white spot on back of throat today

## 2013-10-07 NOTE — ED Notes (Signed)
Patient understanding of importance of follow up care.  Will seek an appointment with f/u MD in the am.

## 2013-10-07 NOTE — ED Notes (Signed)
Patient is anxious to leave.  Have explained delay in care to patient.  He has been most understanding.  Dr. Read DriversMolpus will expedite his assessment and disposition.

## 2013-10-07 NOTE — ED Provider Notes (Signed)
CSN: [id number]     Arrival date & time 10/07/13  1941 History  This chart was scribed for Timothy SeamenJohn L Egypt Welcome, MD by Blanchard KelchNicole Curnes, ED Scribe. The patient was seen in room MH02/MH02. Patient's care was started at 11:32 PM.      Chief Complaint  Patient presents with  . Spot on Left Tonsil      The history is provided by the patient. No language interpreter was used.    HPI Comments: Timothy Franklin is a 48 y.o. male who presents to the Emergency Department complaining of a constant, non-painful white lesion on his left tonsil that was first noticed today. He denies alleviating factors. He is a current smoker.    History reviewed. No pertinent past medical history. Past Surgical History  Procedure Laterality Date  . Circumcision     No family history on file. History  Substance Use Topics  . Smoking status: Current Every Day Smoker -- 0.50 packs/day    Types: Cigarettes  . Smokeless tobacco: Never Used  . Alcohol Use: 4.2 oz/week    7 Cans of beer per week    Review of Systems A complete 10 system review of systems was obtained and all systems are negative except as noted in the HPI and PMH.     Allergies  Review of patient's allergies indicates no known allergies.  Home Medications   Current Outpatient Rx  Name  Route  Sig  Dispense  Refill  . HYDROcodone-acetaminophen (NORCO/VICODIN) 5-325 MG per tablet   Oral   Take 1 tablet by mouth every 4 (four) hours as needed for pain.   20 tablet   0   . ibuprofen (ADVIL,MOTRIN) 800 MG tablet   Oral   Take 1 tablet (800 mg total) by mouth 3 (three) times daily.   21 tablet   0   . naproxen (NAPROSYN) 500 MG tablet   Oral   Take 1 tablet (500 mg total) by mouth 2 (two) times daily.   20 tablet   0   . tadalafil (CIALIS) 5 MG tablet   Oral   Take 5 mg by mouth daily as needed.          Triage Vitals: BP 140/74  Pulse 65  Temp(Src) 99.4 F (37.4 C) (Oral)  Resp 20  Ht 5\' 8"  (1.727 m)  Wt 202 lb (91.627 kg)   BMI 30.72 kg/m2  SpO2 100%  Physical Exam  Nursing note and vitals reviewed. General: Well-developed, well-nourished male in no acute distress; appearance consistent with age of record HENT: normocephalic; atraumatic; white patch on left tonsil that is white tissue; it is not on the tonsil and I am unable to wipe it away; tonsil non-tender when touched with tongue depresser Not appreciating any lymphadenopathy Eyes: pupils equal, round and reactive to light; extraocular muscles intact Neck: supple; no lymphadenopathy appreciated Heart: regular rate and rhythm; no murmurs, rubs or gallops Lungs: clear to auscultation bilaterally Abdomen: soft; nondistended; nontender; no masses or hepatosplenomegaly; bowel sounds present Extremities: No deformity; full range of motion; pulses normal Neurologic: Awake, alert and oriented; motor function intact in all extremities and symmetric; no facial droop Skin: Warm and dry Psychiatric: Normal mood and affect  ED Course  Procedures (including critical care time)  DIAGNOSTIC STUDIES: Oxygen Saturation is 100% on room air, normal by my interpretation.    COORDINATION OF CARE: 11:37 PM- Discussed differential diagnosis which includes normal variant, early tonsillitis and throat cancer. Recommend patient follows up with ENT.  Will provide materials for patient to do so. Return precautions discussed. Patient verbalizes understanding and agrees with treatment plan.   MDM   Final diagnoses:  Tonsillar mass    I personally performed the services described in this documentation, which was scribed in my presence. The recorded information has been reviewed and is accurate.      Timothy Seamen, MD 10/07/13 [id number]

## 2013-12-15 ENCOUNTER — Emergency Department (HOSPITAL_BASED_OUTPATIENT_CLINIC_OR_DEPARTMENT_OTHER)
Admission: EM | Admit: 2013-12-15 | Discharge: 2013-12-15 | Discharge status: Against Medical Advice | Payer: BC Managed Care – PPO | Attending: Emergency Medicine | Admitting: Emergency Medicine

## 2013-12-15 ENCOUNTER — Encounter (HOSPITAL_BASED_OUTPATIENT_CLINIC_OR_DEPARTMENT_OTHER): Payer: Self-pay | Admitting: Emergency Medicine

## 2013-12-15 DIAGNOSIS — F17200 Nicotine dependence, unspecified, uncomplicated: Secondary | ICD-10-CM | POA: Insufficient documentation

## 2013-12-15 DIAGNOSIS — J029 Acute pharyngitis, unspecified: Secondary | ICD-10-CM | POA: Insufficient documentation

## 2013-12-15 NOTE — ED Notes (Signed)
Eve with registration called to say patient was seen leaving the hospital

## 2013-12-15 NOTE — ED Notes (Signed)
Pt reports sore throat and difficulty swallowing since having oral sex a couple days ago.

## 2014-02-04 ENCOUNTER — Encounter (HOSPITAL_BASED_OUTPATIENT_CLINIC_OR_DEPARTMENT_OTHER): Payer: Self-pay | Admitting: Emergency Medicine

## 2014-02-04 ENCOUNTER — Emergency Department (HOSPITAL_BASED_OUTPATIENT_CLINIC_OR_DEPARTMENT_OTHER): Payer: BC Managed Care – PPO

## 2014-02-04 ENCOUNTER — Emergency Department (HOSPITAL_BASED_OUTPATIENT_CLINIC_OR_DEPARTMENT_OTHER)
Admission: EM | Admit: 2014-02-04 | Discharge: 2014-02-04 | Disposition: A | Discharge status: Home / Self Care | Payer: BC Managed Care – PPO | Attending: Emergency Medicine | Admitting: Emergency Medicine

## 2014-02-04 DIAGNOSIS — Z79899 Other long term (current) drug therapy: Secondary | ICD-10-CM | POA: Insufficient documentation

## 2014-02-04 DIAGNOSIS — B9789 Other viral agents as the cause of diseases classified elsewhere: Secondary | ICD-10-CM

## 2014-02-04 DIAGNOSIS — J988 Other specified respiratory disorders: Secondary | ICD-10-CM

## 2014-02-04 DIAGNOSIS — F17200 Nicotine dependence, unspecified, uncomplicated: Secondary | ICD-10-CM | POA: Insufficient documentation

## 2014-02-04 DIAGNOSIS — J069 Acute upper respiratory infection, unspecified: Secondary | ICD-10-CM | POA: Insufficient documentation

## 2014-02-04 MED ORDER — HYDROCODONE-HOMATROPINE 5-1.5 MG/5ML PO SYRP: 5.0000 mL | ORAL_SOLUTION | Freq: Four times a day (QID) | ORAL | Status: DC | PRN

## 2014-02-04 NOTE — Discharge Instructions (Signed)
Read the information below.  Use the prescribed medication as directed.  Please discuss all new medications with your pharmacist.  You may return to the Emergency Department at any time for worsening condition or any new symptoms that concern you.  If you develop high fevers that do not resolve with tylenol or ibuprofen, you have difficulty swallowing or breathing, or you are unable to tolerate fluids by mouth, return to the ER for a recheck.       Viral Infections A viral infection can be caused by different types of viruses.Most viral infections are not serious and resolve on their own. However, some infections may cause severe symptoms and may lead to further complications. SYMPTOMS Viruses can frequently cause:  Minor sore throat.  Aches and pains.  Headaches.  Runny nose.  Different types of rashes.  Watery eyes.  Tiredness.  Cough.  Loss of appetite.  Gastrointestinal infections, resulting in nausea, vomiting, and diarrhea. These symptoms do not respond to antibiotics because the infection is not caused by bacteria. However, you might catch a bacterial infection following the viral infection. This is sometimes called a "superinfection." Symptoms of such a bacterial infection may include:  Worsening sore throat with pus and difficulty swallowing.  Swollen neck glands.  Chills and a high or persistent fever.  Severe headache.  Tenderness over the sinuses.  Persistent overall ill feeling (malaise), muscle aches, and tiredness (fatigue).  Persistent cough.  Yellow, green, or brown mucus production with coughing. HOME CARE INSTRUCTIONS   Only take over-the-counter or prescription medicines for pain, discomfort, diarrhea, or fever as directed by your caregiver.  Drink enough water and fluids to keep your urine clear or pale yellow. Sports drinks can provide valuable electrolytes, sugars, and hydration.  Get plenty of rest and maintain proper nutrition. Soups and  broths with crackers or rice are fine. SEEK IMMEDIATE MEDICAL CARE IF:   You have severe headaches, shortness of breath, chest pain, neck pain, or an unusual rash.  You have uncontrolled vomiting, diarrhea, or you are unable to keep down fluids.  You or your child has an oral temperature above 102 F (38.9 C), not controlled by medicine.  Your baby is older than 3 months with a rectal temperature of 102 F (38.9 C) or higher.  Your baby is 703 months old or younger with a rectal temperature of 100.4 F (38 C) or higher. MAKE SURE YOU:   Understand these instructions.  Will watch your condition.  Will get help right away if you are not doing well or get worse. Document Released: 04/14/2005 Document Revised: 09/27/2011 Document Reviewed: 11/09/2010 [facility] Patient Information 2015 [place], [place]. This information is not intended to replace advice given to you by your health care provider. Make sure you discuss any questions you have with your health care provider.  Cough, Adult  A cough is a reflex that helps clear your throat and airways. It can help heal the body or may be a reaction to an irritated airway. A cough may only last 2 or 3 weeks (acute) or may last more than 8 weeks (chronic).  CAUSES Acute cough:  Viral or bacterial infections. Chronic cough:  Infections.  Allergies.  Asthma.  Post-nasal drip.  Smoking.  Heartburn or acid reflux.  Some medicines.  Chronic lung problems (COPD).  Cancer. SYMPTOMS   Cough.  Fever.  Chest pain.  Increased breathing rate.  High-pitched whistling sound when breathing (wheezing).  Colored mucus that you cough up (sputum). TREATMENT  A bacterial cough may be treated with antibiotic medicine.  A viral cough must run its course and will not respond to antibiotics.  Your caregiver may recommend other treatments if you have a chronic cough. HOME CARE INSTRUCTIONS   Only take over-the-counter or prescription  medicines for pain, discomfort, or fever as directed by your caregiver. Use cough suppressants only as directed by your caregiver.  Use a cold steam vaporizer or humidifier in your bedroom or home to help loosen secretions.  Sleep in a semi-upright position if your cough is worse at night.  Rest as needed.  Stop smoking if you smoke. SEEK IMMEDIATE MEDICAL CARE IF:   You have pus in your sputum.  Your cough starts to worsen.  You cannot control your cough with suppressants and are losing sleep.  You begin coughing up blood.  You have difficulty breathing.  You develop pain which is getting worse or is uncontrolled with medicine.  You have a fever. MAKE SURE YOU:   Understand these instructions.  Will watch your condition.  Will get help right away if you are not doing well or get worse. Document Released: 01/01/2011 Document Revised: 09/27/2011 Document Reviewed: 01/01/2011 [facility] Patient Information 2015 [place], [place]. This information is not intended to replace advice given to you by your health care provider. Make sure you discuss any questions you have with your health care provider.

## 2014-02-04 NOTE — ED Notes (Signed)
No actual cough while in Room 11 during his visit.

## 2014-02-04 NOTE — ED Provider Notes (Signed)
CSN: [id number]     Arrival date & time 02/04/14  1844 History   First MD Initiated Contact with Patient 02/04/14 1902     No chief complaint on file.    (Consider location/radiation/quality/duration/timing/severity/associated sxs/prior Treatment) The history is provided by the patient.    Patient presents with URI symptoms x 7 days with residual cough.  States he initially had sinus congestion, rhinorrhea, but these have resolved.  Has cough, worse at night, occasionally productive of yellow sputum.  Denies fevers, CP, SOB, hemoptysis, leg swelling.  Is a [employer] driver, has had contact with sick people.  No recent travel.  States he came to get checked out because his wife wanted to make sure he didn't have pneumonia.  He is a smoker, 6 cigarettes daily.   History reviewed. No pertinent past medical history. Past Surgical History  Procedure Laterality Date  . Circumcision     No family history on file. History  Substance Use Topics  . Smoking status: Current Every Day Smoker -- 0.50 packs/day    Types: Cigarettes  . Smokeless tobacco: Never Used  . Alcohol Use: 4.2 oz/week    7 Cans of beer per week    Review of Systems  All other systems reviewed and are negative.     Allergies  Review of patient's allergies indicates no known allergies.  Home Medications   Prior to Admission medications   Medication Sig Start Date End Date Taking? Authorizing Provider  tadalafil (CIALIS) 5 MG tablet Take 5 mg by mouth daily as needed.    Historical Provider, MD   BP 139/70  Pulse 56  Temp(Src) 97.8 F (36.6 C) (Oral)  Resp 18  Ht 5\' 8"  (1.727 m)  Wt 200 lb (90.719 kg)  BMI 30.42 kg/m2  SpO2 100% Physical Exam  Nursing note and vitals reviewed. Constitutional: He appears well-developed and well-nourished. No distress.  HENT:  Head: Normocephalic and atraumatic.  Neck: Neck supple.  Cardiovascular: Normal rate and regular rhythm.   Pulmonary/Chest: Effort normal. No  respiratory distress. He has wheezes (very mild, very slight wheeze bilaterally). He has no rales.  Abdominal: Soft. He exhibits no distension and no mass. There is no tenderness. There is no rebound and no guarding.  Neurological: He is alert. He exhibits normal muscle tone.  Skin: He is not diaphoretic.    ED Course  Procedures (including critical care time) Labs Review Labs Reviewed - No data to display  Imaging Review Dg Chest 2 View  02/04/2014   CLINICAL DATA:  Coughing  EXAM: CHEST  2 VIEW  COMPARISON:  04/21/2013  FINDINGS: Normal mediastinum and cardiac silhouette. Normal pulmonary vasculature. No evidence of effusion, infiltrate, or pneumothorax. No acute bony abnormality.  IMPRESSION: No active cardiopulmonary disease.   Electronically Signed   By: Genevive Bi M.D.   On: 02/04/2014 19:47     EKG Interpretation None      Counseled on smoking cessation.  Pt declines albuterol inhaler.   Filed Vitals:   02/04/14 1847  BP: 139/70  Pulse: 56  Temp: 97.8 F (36.6 C)  Resp: 18     MDM   Final diagnoses:  Viral respiratory illness    Afebrile nontoxic immunocompetent patient with cough, cold symptoms x 1 week.  CXR negative. D/C home with cough medication.  PCP follow up.  Discussed result, findings, treatment, and follow up  with patient.  Pt given return precautions.  Pt verbalizes understanding and agrees with plan.  ProsserEmily Destenee Guerry, PA-C 02/04/14 2023

## 2014-02-04 NOTE — ED Provider Notes (Signed)
Medical screening examination/treatment/procedure(s) were performed by non-physician practitioner and as supervising physician I was immediately available for consultation/collaboration.   EKG Interpretation None       Ethelda ChickMartha K Linker, MD 02/04/14 2035

## 2014-02-04 NOTE — ED Notes (Signed)
Congestion and cough for over a week. Worse at night. Smoker.

## 2014-02-21 ENCOUNTER — Encounter (HOSPITAL_BASED_OUTPATIENT_CLINIC_OR_DEPARTMENT_OTHER): Payer: Self-pay | Admitting: Emergency Medicine

## 2014-02-21 ENCOUNTER — Emergency Department (HOSPITAL_BASED_OUTPATIENT_CLINIC_OR_DEPARTMENT_OTHER)
Admission: EM | Admit: 2014-02-21 | Discharge: 2014-02-21 | Disposition: A | Discharge status: Home / Self Care | Payer: BC Managed Care – PPO | Attending: Emergency Medicine | Admitting: Emergency Medicine

## 2014-02-21 DIAGNOSIS — J3489 Other specified disorders of nose and nasal sinuses: Secondary | ICD-10-CM | POA: Insufficient documentation

## 2014-02-21 DIAGNOSIS — R05 Cough: Secondary | ICD-10-CM

## 2014-02-21 DIAGNOSIS — Z79899 Other long term (current) drug therapy: Secondary | ICD-10-CM | POA: Insufficient documentation

## 2014-02-21 DIAGNOSIS — F17200 Nicotine dependence, unspecified, uncomplicated: Secondary | ICD-10-CM | POA: Insufficient documentation

## 2014-02-21 DIAGNOSIS — J329 Chronic sinusitis, unspecified: Secondary | ICD-10-CM | POA: Insufficient documentation

## 2014-02-21 DIAGNOSIS — R059 Cough, unspecified: Secondary | ICD-10-CM

## 2014-02-21 MED ORDER — BENZONATATE 200 MG PO CAPS: 200.0000 mg | ORAL_CAPSULE | Freq: Two times a day (BID) | ORAL | Status: DC | PRN

## 2014-02-21 MED ORDER — LORATADINE 10 MG PO TABS: 10.0000 mg | ORAL_TABLET | Freq: Every day | ORAL | Status: DC

## 2014-02-21 MED ORDER — FLUTICASONE PROPIONATE 50 MCG/ACT NA SUSP: 2.0000 | Freq: Every day | NASAL | Status: DC

## 2014-02-21 NOTE — ED Notes (Signed)
MD at bedside. 

## 2014-02-21 NOTE — ED Provider Notes (Signed)
CSN: [id number]     Arrival date & time 02/21/14  2214 History  This chart was scribed for Timothy SheffieldForrest Timothy Suire, MD by Phillis HaggisGabriella Franklin, ED Scribe. This patient was seen in room MH05/MH05 and patient care was started at 10:23 PM.   Chief Complaint  Patient presents with  . Cough   Patient is a 48 y.o. male presenting with cough. The history is provided by the patient. No language interpreter was used.  Cough Cough characteristics:  Productive Sputum characteristics:  Clear Severity:  Moderate Onset quality:  Gradual Duration:  2 weeks Timing:  Constant Progression:  Improving Chronicity:  New Smoker: yes   Relieved by:  Cough suppressants Associated symptoms: no chest pain, no fever, no headaches and no shortness of breath   HPI Comments: Timothy Franklin is a 48 y.o. male who presents to the Emergency Department complaining of a productive cough onset 2 weeks ago. He states that the cough starts when he wakes up but the cough gradually gets better throughout the day. He reports associated congestion and sinus drainage. He reports coughing up clear mucous. He states that he was eating today and something tickled his throat when he started coughing and his wife told him to come to the ED. He reports that he has had chest x-rays done in the past with negative results. He states that the medication he was given the last time in the ED has improved his cough. He states that he has acid reflux, for which he takes omeprazole everyday. He reports smoking 1/2 PPD, but states that he is trying to quit and has cut down. He reports that he drinks beer 3-4 days a week socially. He denies fever. He denies history of any other medical problems. He reports family history of HTN and DM. 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   History reviewed. No pertinent past medical history. Past Surgical History  Procedure Laterality Date  . Circumcision     History reviewed. No pertinent family history. History  Substance  Use Topics  . Smoking status: Current Every Day Smoker -- 0.50 packs/day    Types: Cigarettes  . Smokeless tobacco: Never Used  . Alcohol Use: 4.2 oz/week    7 Cans of beer per week    Review of Systems  Constitutional: Negative for fever and fatigue.  HENT: Positive for congestion, postnasal drip and sinus pressure. Negative for drooling.   Eyes: Negative for pain.  Respiratory: Positive for cough. Negative for shortness of breath.   Cardiovascular: Negative for chest pain.  Gastrointestinal: Negative for nausea, vomiting, abdominal pain and diarrhea.  Genitourinary: Negative for dysuria and hematuria.  Musculoskeletal: Negative for neck pain.  Skin: Negative for color change.  Neurological: Negative for dizziness and headaches.  Hematological: Negative for adenopathy.  Psychiatric/Behavioral: Negative for behavioral problems.  All other systems reviewed and are negative.  Allergies  Review of patient's allergies indicates no known allergies.  Home Medications   Prior to Admission medications   Medication Sig Start Date End Date Taking? Authorizing Provider  HYDROcodone-homatropine (HYCODAN) 5-1.5 MG/5ML syrup Take 5 mLs by mouth every 6 (six) hours as needed for cough. 02/04/14   Trixie DredgeEmily West, PA-C  tadalafil (CIALIS) 5 MG tablet Take 5 mg by mouth daily as needed.    Historical Provider, MD   BP 142/79  Pulse 74  Temp(Src) 98.9 F (37.2 C) (Oral)  Resp 18  Wt 200 lb (90.719 kg)  SpO2 100%  Physical Exam  Nursing note and  vitals reviewed. Constitutional: He appears well-developed and well-nourished. No distress.  HENT:  Head: Normocephalic and atraumatic.  Mouth/Throat: Oropharynx is clear and moist.  Mild mottling of the posterior oropharynx.  Eyes: Conjunctivae are normal. Pupils are equal, round, and reactive to light. Right eye exhibits no discharge. Left eye exhibits no discharge.  Neck: Neck supple.  Cardiovascular: Normal rate, regular rhythm, normal heart  sounds and intact distal pulses.  Exam reveals no gallop and no friction rub.   No murmur heard. Pulmonary/Chest: Effort normal and breath sounds normal. No respiratory distress.  Abdominal: Soft. He exhibits no distension. There is no tenderness.  Musculoskeletal: He exhibits no edema and no tenderness.  Neurological: He is alert.  Skin: Skin is warm and dry.  Psychiatric: He has a normal mood and affect. His behavior is normal. Thought content normal.    ED Course  Procedures (including critical care time)  DIAGNOSTIC STUDIES: Oxygen Saturation is 100% on room air, normal by my interpretation.    COORDINATION OF CARE: 10:27 PM-Discussed treatment plan which includes Flonase and Tessalon Pearls with pt at bedside and pt agreed to plan.   Labs Review Labs Reviewed - No data to display  Imaging Review No results found.   EKG Interpretation None      MDM   Final diagnoses:  Cough    10:45 PM 48 y.o. male who pw ongoing cough. Seen here on 7/20 for a cough and got a prescription for Hycodan syrup. He notes that this significantly helped with his cough. He denies any fevers, vomiting, diarrhea. He is treated for GERD with a PPI. He notes his cough is most prominent in the morning and not productive usually. He states that he has not typically had a cough during the day or evening. He also notes some nasal congestion. He is afebrile and vital signs are unremarkable here. Possibly related to allergies versus mild ongoing cough associated with a power URI. Will recommend some over-the-counter medications.  10:47 PM:  I have discussed the diagnosis/risks/treatment options with the patient and believe the pt to be eligible for discharge home to follow-up with his pcp as needed. We also discussed returning to the ED immediately if new or worsening sx occur. We discussed the sx which are most concerning (e.g., fever, sob, cp) that necessitate immediate return. Medications administered to  the patient during their visit and any new prescriptions provided to the patient are listed below.  Medications given during this visit Medications - No data to display  New Prescriptions   BENZONATATE (TESSALON) 200 MG CAPSULE    Take 1 capsule (200 mg total) by mouth 2 (two) times daily as needed for cough.   FLUTICASONE (FLONASE) 50 MCG/ACT NASAL SPRAY    Place 2 sprays into both nostrils daily.   LORATADINE (CLARITIN) 10 MG TABLET    Take 1 tablet (10 mg total) by mouth daily.       I personally performed the services described in this documentation, which was scribed in my presence. The recorded information has been reviewed and is accurate.     Timothy Sheffield, MD 02/21/14 [phone]

## 2014-02-21 NOTE — Discharge Instructions (Signed)
Cough, Adult   A cough is a reflex. It helps you clear your throat and airways. A cough can help heal your body. A cough can last 2 or 3 weeks (acute) or may last more than 8 weeks (chronic). Some common causes of a cough can include an infection, allergy, or a cold.  HOME CARE  · Only take medicine as told by your doctor.  · If given, take your medicines (antibiotics) as told. Finish them even if you start to feel better.  · Use a cold steam vaporizer or humidifier in your home. This can help loosen thick spit (secretions).  · Sleep so you are almost sitting up (semi-upright). Use pillows to do this. This helps reduce coughing.  · Rest as needed.  · Stop smoking if you smoke.  GET HELP RIGHT AWAY IF:  · You have yellowish-white fluid (pus) in your thick spit.  · Your cough gets worse.  · Your medicine does not reduce coughing, and you are losing sleep.  · You cough up blood.  · You have trouble breathing.  · Your pain gets worse and medicine does not help.  · You have a fever.  MAKE SURE YOU:   · Understand these instructions.  · Will watch your condition.  · Will get help right away if you are not doing well or get worse.  Document Released: 03/18/2011 Document Revised: 11/19/2013 Document Reviewed: 03/18/2011  ExitCare® Patient Information ©2015 [employer], LLC. This information is not intended to replace advice given to you by your health care provider. Make sure you discuss any questions you have with your health care provider.

## 2014-03-30 ENCOUNTER — Emergency Department (HOSPITAL_BASED_OUTPATIENT_CLINIC_OR_DEPARTMENT_OTHER)
Admission: EM | Admit: 2014-03-30 | Discharge: 2014-03-30 | Disposition: A | Discharge status: Home / Self Care | Payer: BC Managed Care – PPO | Attending: Emergency Medicine | Admitting: Emergency Medicine

## 2014-03-30 ENCOUNTER — Encounter (HOSPITAL_BASED_OUTPATIENT_CLINIC_OR_DEPARTMENT_OTHER): Payer: Self-pay | Admitting: Emergency Medicine

## 2014-03-30 DIAGNOSIS — Y929 Unspecified place or not applicable: Secondary | ICD-10-CM | POA: Insufficient documentation

## 2014-03-30 DIAGNOSIS — IMO0002 Reserved for concepts with insufficient information to code with codable children: Secondary | ICD-10-CM | POA: Insufficient documentation

## 2014-03-30 DIAGNOSIS — Z79899 Other long term (current) drug therapy: Secondary | ICD-10-CM | POA: Diagnosis not present

## 2014-03-30 DIAGNOSIS — Y939 Activity, unspecified: Secondary | ICD-10-CM | POA: Diagnosis not present

## 2014-03-30 DIAGNOSIS — F17200 Nicotine dependence, unspecified, uncomplicated: Secondary | ICD-10-CM | POA: Diagnosis not present

## 2014-03-30 DIAGNOSIS — S40269A Insect bite (nonvenomous) of unspecified shoulder, initial encounter: Secondary | ICD-10-CM | POA: Diagnosis not present

## 2014-03-30 DIAGNOSIS — W57XXXA Bitten or stung by nonvenomous insect and other nonvenomous arthropods, initial encounter: Secondary | ICD-10-CM | POA: Diagnosis not present

## 2014-03-30 DIAGNOSIS — R21 Rash and other nonspecific skin eruption: Secondary | ICD-10-CM | POA: Diagnosis present

## 2014-03-30 NOTE — ED Notes (Signed)
Pt reports rash to left arm since yesterday. Unsure if something bit him. Took benadryl last night with some relief.

## 2014-03-30 NOTE — ED Provider Notes (Signed)
CSN: [id number]     Arrival date & time 03/30/14  1634 History   First MD Initiated Contact with Patient 03/30/14 1733     Chief Complaint  Patient presents with  . Rash     (Consider location/radiation/quality/duration/timing/severity/associated sxs/prior Treatment) Patient is a 48 y.o. male presenting with rash. The history is provided by the patient. No language interpreter was used.  Rash Location:  Shoulder/arm Shoulder/arm rash location:  L upper arm Quality: blistering, itchiness and redness   Severity:  Mild Onset quality:  Sudden Duration:  1 day Timing:  Constant Progression:  Worsening Chronicity:  New Relieved by:  Antihistamines Worsened by:  Nothing tried Ineffective treatments:  None tried   History reviewed. No pertinent past medical history. Past Surgical History  Procedure Laterality Date  . Circumcision     No family history on file. History  Substance Use Topics  . Smoking status: Current Every Day Smoker -- 0.50 packs/day    Types: Cigarettes  . Smokeless tobacco: Never Used  . Alcohol Use: 4.2 oz/week    7 Cans of beer per week    Review of Systems  Skin: Positive for rash.  All other systems reviewed and are negative.     Allergies  Review of patient's allergies indicates no known allergies.  Home Medications   Prior to Admission medications   Medication Sig Start Date End Date Taking? Authorizing Provider  benzonatate (TESSALON) 200 MG capsule Take 1 capsule (200 mg total) by mouth 2 (two) times daily as needed for cough. 02/21/14   Purvis Sheffield, MD  fluticasone (FLONASE) 50 MCG/ACT nasal spray Place 2 sprays into both nostrils daily. 02/21/14   Purvis Sheffield, MD  HYDROcodone-homatropine [facility]) 5-1.5 MG/5ML syrup Take 5 mLs by mouth every 6 (six) hours as needed for cough. 02/04/14   Trixie Dredge, PA-C  loratadine (CLARITIN) 10 MG tablet Take 1 tablet (10 mg total) by mouth daily. 02/21/14   Purvis Sheffield, MD  tadalafil (CIALIS) 5  MG tablet Take 5 mg by mouth daily as needed.    Historical Provider, MD   BP 137/88  Pulse 52  Temp(Src) 98 F (36.7 C) (Oral)  Resp 18  SpO2 100% Physical Exam  Nursing note and vitals reviewed. Constitutional: He is oriented to person, place, and time. He appears well-developed and well-nourished.  Eyes: Pupils are equal, round, and reactive to light.  Cardiovascular: Normal rate.   Pulmonary/Chest: Effort normal.  Neurological: He is alert and oriented to person, place, and time. He has normal reflexes.  Skin: Rash noted.  Psychiatric: He has a normal mood and affect.    ED Course  Procedures (including critical care time) Labs Review Labs Reviewed - No data to display  Imaging Review No results found.   EKG Interpretation None      MDM   Final diagnoses:  Insect bite    Benadryl Hydrocortisone cream     Elson Areas, PA-C 03/30/14 1756

## 2014-03-30 NOTE — Discharge Instructions (Signed)

## 2014-04-05 NOTE — ED Provider Notes (Signed)
Medical screening examination/treatment/procedure(s) were performed by non-physician practitioner and as supervising physician I was immediately available for consultation/collaboration.   EKG Interpretation None        Devony Mcgrady, MD 04/05/14 0003 

## 2014-04-10 ENCOUNTER — Emergency Department (HOSPITAL_BASED_OUTPATIENT_CLINIC_OR_DEPARTMENT_OTHER)
Admission: EM | Admit: 2014-04-10 | Discharge: 2014-04-11 | Disposition: A | Discharge status: Home / Self Care | Payer: BC Managed Care – PPO | Attending: Emergency Medicine | Admitting: Emergency Medicine

## 2014-04-10 ENCOUNTER — Encounter (HOSPITAL_BASED_OUTPATIENT_CLINIC_OR_DEPARTMENT_OTHER): Payer: Self-pay | Admitting: Emergency Medicine

## 2014-04-10 DIAGNOSIS — F17200 Nicotine dependence, unspecified, uncomplicated: Secondary | ICD-10-CM | POA: Diagnosis not present

## 2014-04-10 DIAGNOSIS — IMO0002 Reserved for concepts with insufficient information to code with codable children: Secondary | ICD-10-CM | POA: Diagnosis not present

## 2014-04-10 DIAGNOSIS — Z79899 Other long term (current) drug therapy: Secondary | ICD-10-CM | POA: Insufficient documentation

## 2014-04-10 DIAGNOSIS — R21 Rash and other nonspecific skin eruption: Secondary | ICD-10-CM | POA: Diagnosis present

## 2014-04-10 DIAGNOSIS — W57XXXA Bitten or stung by nonvenomous insect and other nonvenomous arthropods, initial encounter: Secondary | ICD-10-CM

## 2014-04-10 DIAGNOSIS — L509 Urticaria, unspecified: Secondary | ICD-10-CM | POA: Diagnosis not present

## 2014-04-10 MED ORDER — METHYLPREDNISOLONE SODIUM SUCC 125 MG IJ SOLR
125.0000 mg | Freq: Once | INTRAMUSCULAR | Status: AC
Start: 2014-04-11 — End: 2014-04-10
  Administered 2014-04-10: 125 mg via INTRAMUSCULAR
  Filled 2014-04-10: qty 2

## 2014-04-10 MED ORDER — HYDROXYZINE HCL 25 MG PO TABS: 25.0000 mg | ORAL_TABLET | Freq: Four times a day (QID) | ORAL | Status: DC | PRN

## 2014-04-10 MED ORDER — MUPIROCIN CALCIUM 2 % EX CREA
TOPICAL_CREAM | Freq: Once | CUTANEOUS | Status: AC
  Administered 2014-04-10: via TOPICAL
  Filled 2014-04-10: qty 15

## 2014-04-10 NOTE — ED Provider Notes (Signed)
CSN: [id number]     Arrival date & time 04/10/14  2155 History  This chart was scribed for Hanley Seamen, MD by Luisa Dago, ED Scribe. This patient was seen in room MH02/MH02 and the patient's care was started at 11:44 PM.     Chief Complaint  Patient presents with  . Rash   HPI HPI Comments: Timothy Franklin is a 48 y.o. male who presents to the Emergency Department complaining of a gradual onset spreading rash to the anterior chest, neck, and upper back that he first noticed today after getting off of work. Pt is complaining of associated itching. Denies any pain associated pain, sick contacts, exposure to new detergents, new soaps, new lotions, SOB, chest pain, nausea, emesis, or facial swelling. The rash began with lesions to the back of the neck and right earlobe, though he denies the possibility of insect bites or other trauma.  History reviewed. No pertinent past medical history. Past Surgical History  Procedure Laterality Date  . Circumcision     No family history on file. History  Substance Use Topics  . Smoking status: Current Every Day Smoker -- 0.50 packs/day    Types: Cigarettes  . Smokeless tobacco: Never Used  . Alcohol Use: 4.2 oz/week    7 Cans of beer per week    Review of Systems A complete 10 system review of systems was obtained and all systems are negative except as noted in the HPI and PMH.   Allergies  Review of patient's allergies indicates no known allergies.  Home Medications   Prior to Admission medications   Medication Sig Start Date End Date Taking? Authorizing Provider  benzonatate (TESSALON) 200 MG capsule Take 1 capsule (200 mg total) by mouth 2 (two) times daily as needed for cough. 02/21/14   Purvis Sheffield, MD  fluticasone (FLONASE) 50 MCG/ACT nasal spray Place 2 sprays into both nostrils daily. 02/21/14   Purvis Sheffield, MD  HYDROcodone-homatropine [facility]) 5-1.5 MG/5ML syrup Take 5 mLs by mouth every 6 (six) hours as needed for cough.  02/04/14   Trixie Dredge, PA-C  hydrOXYzine (ATARAX/VISTARIL) 25 MG tablet Take 1-2 tablets (25-50 mg total) by mouth every 6 (six) hours as needed for itching (may cause drowsiness). 04/10/14   Carlisle Beers Keefe Zawistowski, MD  loratadine (CLARITIN) 10 MG tablet Take 1 tablet (10 mg total) by mouth daily. 02/21/14   Purvis Sheffield, MD  tadalafil (CIALIS) 5 MG tablet Take 5 mg by mouth daily as needed.    Historical Provider, MD   Triage vitals:BP 138/82  Pulse 76  Temp(Src) 97.9 F (36.6 C)  Resp 16  Ht  (1.727 m)  Wt 199 lb (90.266 kg)  BMI 30.26 kg/m2  SpO2 99%  Physical Exam  Nursing note and vitals reviewed. General: Well-developed, well-nourished male in no acute distress; appearance consistent with age of record HENT: normocephalic; atraumatic Eyes: pupils equal, round and reactive to light; extraocular muscles intact Neck: supple Heart: regular rate and rhythm; no murmurs, rubs or gallops Lungs: clear to auscultation bilaterally Abdomen: soft; nondistended; nontender; no masses or hepatosplenomegaly; bowel sounds present Extremities: No deformity; full range of motion; pulses normal Neurologic: Awake, alert and oriented; motor function intact in all extremities and symmetric; no facial droop Skin: Warm and dry; 3 raised lesions with central crusting at the back of the neck and another of the right earlobe. Generalized urticarial rash most prominent on the trunk.  Psychiatric: Normal mood and affect   ED Course  Procedures (including  critical care time)  DIAGNOSTIC STUDIES: Oxygen Saturation is 99% on RA, normal by my interpretation.    COORDINATION OF CARE: 11:49 PM- Pt advised of plan for treatment and pt agrees.     MDM  Suspect insect bites with local reaction as well as mild systemic urticarial reaction.+  Final diagnoses:  Insect bites  Hives   I personally performed the services described in this documentation, which was scribed in my presence. The recorded information  has been reviewed and is accurate.    Hanley Seamen, MD 04/10/14 [phone]

## 2014-04-10 NOTE — ED Notes (Signed)
C/o bumps on back of neck, chest, itching onset today

## 2014-04-10 NOTE — ED Notes (Signed)
Pt has red itchy rash all over

## 2014-06-18 ENCOUNTER — Encounter (HOSPITAL_BASED_OUTPATIENT_CLINIC_OR_DEPARTMENT_OTHER): Payer: Self-pay | Admitting: *Deleted

## 2014-06-18 ENCOUNTER — Emergency Department (HOSPITAL_BASED_OUTPATIENT_CLINIC_OR_DEPARTMENT_OTHER)
Admission: EM | Admit: 2014-06-18 | Discharge: 2014-06-18 | Disposition: A | Discharge status: Home / Self Care | Payer: BC Managed Care – PPO | Attending: Emergency Medicine | Admitting: Emergency Medicine

## 2014-06-18 DIAGNOSIS — Z720 Tobacco use: Secondary | ICD-10-CM | POA: Diagnosis not present

## 2014-06-18 DIAGNOSIS — J029 Acute pharyngitis, unspecified: Secondary | ICD-10-CM | POA: Insufficient documentation

## 2014-06-18 DIAGNOSIS — Z79899 Other long term (current) drug therapy: Secondary | ICD-10-CM | POA: Diagnosis not present

## 2014-06-18 DIAGNOSIS — Z7951 Long term (current) use of inhaled steroids: Secondary | ICD-10-CM | POA: Diagnosis not present

## 2014-06-18 DIAGNOSIS — R591 Generalized enlarged lymph nodes: Secondary | ICD-10-CM

## 2014-06-18 MED ORDER — CEPHALEXIN 500 MG PO CAPS: 500.0000 mg | ORAL_CAPSULE | Freq: Four times a day (QID) | ORAL | Status: DC

## 2014-06-18 NOTE — ED Notes (Signed)
PA at bedside.

## 2014-06-18 NOTE — ED Notes (Signed)
Pt c/o sore throat x 1 day

## 2014-06-18 NOTE — Discharge Instructions (Signed)
Cervical Adenitis °You have a swollen lymph gland in your neck. This commonly happens with Strep and virus infections, dental problems, insect bites, and injuries about the face, scalp, or neck. The lymph glands swell as the body fights the infection or heals the injury. Swelling and firmness typically lasts for several weeks after the infection or injury is healed. Rarely lymph glands can become swollen because of cancer or TB. °Antibiotics are prescribed if there is evidence of an infection. Sometimes an infected lymph gland becomes filled with pus. This condition may require opening up the abscessed gland by draining it surgically. Most of the time infected glands return to normal within two weeks. Do not poke or squeeze the swollen lymph nodes. That may keep them from shrinking back to their normal size. If the lymph gland is still swollen after 2 weeks, further medical evaluation is needed.  °SEEK IMMEDIATE MEDICAL CARE IF:  °You have difficulty swallowing or breathing, increased swelling, severe pain, or a high fever.  °Document Released: 07/05/2005 Document Revised: 09/27/2011 Document Reviewed: 12/25/2006 °ExitCare® Patient Information ©2015 [employer]. This information is not intended to replace advice given to you by your health care provider. Make sure you discuss any questions you have with your health care provider. °Pharyngitis °Pharyngitis is redness, pain, and swelling (inflammation) of your pharynx.  °CAUSES  °Pharyngitis is usually caused by infection. Most of the time, these infections are from viruses (viral) and are part of a cold. However, sometimes pharyngitis is caused by bacteria (bacterial). Pharyngitis can also be caused by allergies. Viral pharyngitis may be spread from person to person by coughing, sneezing, and personal items or utensils (cups, forks, spoons, toothbrushes). Bacterial pharyngitis may be spread from person to person by more intimate contact, such as kissing.  °SIGNS  AND SYMPTOMS  °Symptoms of pharyngitis include:   °· Sore throat.   °· Tiredness (fatigue).   °· Low-grade fever.   °· Headache. °· Joint pain and muscle aches. °· Skin rashes. °· Swollen lymph nodes. °· Plaque-like film on throat or tonsils (often seen with bacterial pharyngitis). °DIAGNOSIS  °Your health care provider will ask you questions about your illness and your symptoms. Your medical history, along with a physical exam, is often all that is needed to diagnose pharyngitis. Sometimes, a rapid strep test is done. Other lab tests may also be done, depending on the suspected cause.  °TREATMENT  °Viral pharyngitis will usually get better in 3-4 days without the use of medicine. Bacterial pharyngitis is treated with medicines that kill germs (antibiotics).  °HOME CARE INSTRUCTIONS  °· Drink enough water and fluids to keep your urine clear or pale yellow.   °· Only take over-the-counter or prescription medicines as directed by your health care provider:   °¨ If you are prescribed antibiotics, make sure you finish them even if you start to feel better.   °¨ Do not take aspirin.   °· Get lots of rest.   °· Gargle with 8 oz of salt water (½ tsp of salt per 1 qt of water) as often as every 1-2 hours to soothe your throat.   °· Throat lozenges (if you are not at risk for choking) or sprays may be used to soothe your throat. °SEEK MEDICAL CARE IF:  °· You have large, tender lumps in your neck. °· You have a rash. °· You cough up green, yellow-brown, or bloody spit. °SEEK IMMEDIATE MEDICAL CARE IF:  °· Your neck becomes stiff. °· You drool or are unable to swallow liquids. °· You vomit or are   unable to keep medicines or liquids down. °· You have severe pain that does not go away with the use of recommended medicines. °· You have trouble breathing (not caused by a stuffy nose). °MAKE SURE YOU:  °· Understand these instructions. °· Will watch your condition. °· Will get help right away if you are not doing well or get  worse. °Document Released: 07/05/2005 Document Revised: 04/25/2013 Document Reviewed: 03/12/2013 °ExitCare® Patient Information ©2015 [employer]. This information is not intended to replace advice given to you by your health care provider. Make sure you discuss any questions you have with your health care provider. ° °

## 2014-06-18 NOTE — ED Provider Notes (Signed)
CSN: [id number]     Arrival date & time 06/18/14  2003 History   First MD Initiated Contact with Patient 06/18/14 2020     Chief Complaint  Patient presents with  . Sore Throat     (Consider location/radiation/quality/duration/timing/severity/associated sxs/prior Treatment) Patient is a 48 y.o. male presenting with pharyngitis. The history is provided by the patient. No language interpreter was used.  Sore Throat This is a new problem. The current episode started today. The problem occurs constantly. The problem has been gradually worsening. Associated symptoms include neck pain and a sore throat. Nothing aggravates the symptoms. He has tried nothing for the symptoms. The treatment provided moderate relief.   patient complains of a swollen area to the left side of his neck. He should also complains of a sore throat. Patient reports he is concerned about the swollen area in his neck because earlier this year he was diagnosed with a thyroid nodule. Patient reports he has been told that nodule is benign. He is scheduled for follow-up in 6 months.  History reviewed. No pertinent past medical history. Past Surgical History  Procedure Laterality Date  . Circumcision     History reviewed. No pertinent family history. History  Substance Use Topics  . Smoking status: Current Every Day Smoker -- 0.50 packs/day    Types: Cigarettes  . Smokeless tobacco: Never Used  . Alcohol Use: 4.2 oz/week    7 Cans of beer per week    Review of Systems  HENT: Positive for sore throat.   Musculoskeletal: Positive for neck pain.  All other systems reviewed and are negative.     Allergies  Review of patient's allergies indicates no known allergies.  Home Medications   Prior to Admission medications   Medication Sig Start Date End Date Taking? Authorizing Provider  benzonatate (TESSALON) 200 MG capsule Take 1 capsule (200 mg total) by mouth 2 (two) times daily as needed for cough. 02/21/14   Purvis SheffieldForrest  Harrison, MD  cephALEXin (KEFLEX) 500 MG capsule Take 1 capsule (500 mg total) by mouth 4 (four) times daily. 06/18/14   Elson AreasLeslie K Vinny Taranto, PA-C  fluticasone [facility](FLONASE) 50 MCG/ACT nasal spray Place 2 sprays into both nostrils daily. 02/21/14   Purvis SheffieldForrest Harrison, MD  HYDROcodone-homatropine [facility](HYCODAN) 5-1.5 MG/5ML syrup Take 5 mLs by mouth every 6 (six) hours as needed for cough. 02/04/14   Trixie DredgeEmily West, PA-C  hydrOXYzine (ATARAX/VISTARIL) 25 MG tablet Take 1-2 tablets (25-50 mg total) by mouth every 6 (six) hours as needed for itching (may cause drowsiness). 04/10/14   Carlisle BeersJohn L Molpus, MD  loratadine (CLARITIN) 10 MG tablet Take 1 tablet (10 mg total) by mouth daily. 02/21/14   Purvis SheffieldForrest Harrison, MD  tadalafil (CIALIS) 5 MG tablet Take 5 mg by mouth daily as needed.    Historical Provider, MD   BP 166/65 mmHg  Pulse 58  Temp(Src) 98.3 F (36.8 C)  Resp 18  Ht 5\' 8"  (1.727 m)  Wt 198 lb (89.812 kg)  BMI 30.11 kg/m2  SpO2 100% Physical Exam  Constitutional: He is oriented to person, place, and time. He appears well-developed and well-nourished.  HENT:  Head: Normocephalic.  Left tonsil is enlarged and exudative  Eyes: EOM are normal. Pupils are equal, round, and reactive to light.  Neck: Normal range of motion.  Left-sided cervical lymphadenopathy.  Pulmonary/Chest: Effort normal.  Abdominal: He exhibits no distension.  Musculoskeletal: Normal range of motion.  Neurological: He is alert and oriented to person, place, and time.  Skin:  Skin is warm.  Psychiatric: He has a normal mood and affect.  Nursing note and vitals reviewed.   ED Course  Procedures (including critical care time) Labs Review Labs Reviewed - No data to display  Imaging Review No results found.   EKG Interpretation None      MDM I will treat patient with Keflex he is advised to see Dr. Christell ConstantMoore for recheck. I suspect lymphadenopathy is secondary to tonsillar infection/inflammation.  He is advised warm salt water gargles  lozenges. He is to return if symptoms worsen or change    Final diagnoses:  Lymphadenopathy  Pharyngitis        Elson AreasLeslie K Ryheem Jay, PA-C 06/18/14 2115  Mirian MoMatthew Gentry, MD 06/23/14 [phone]

## 2014-07-31 ENCOUNTER — Emergency Department (HOSPITAL_BASED_OUTPATIENT_CLINIC_OR_DEPARTMENT_OTHER)
Admission: EM | Admit: 2014-07-31 | Discharge: 2014-07-31 | Disposition: A | Discharge status: Home / Self Care | Payer: BLUE CROSS/BLUE SHIELD | Attending: Emergency Medicine | Admitting: Emergency Medicine

## 2014-07-31 ENCOUNTER — Encounter (HOSPITAL_BASED_OUTPATIENT_CLINIC_OR_DEPARTMENT_OTHER): Payer: Self-pay | Admitting: *Deleted

## 2014-07-31 DIAGNOSIS — Z792 Long term (current) use of antibiotics: Secondary | ICD-10-CM | POA: Diagnosis not present

## 2014-07-31 DIAGNOSIS — Z7951 Long term (current) use of inhaled steroids: Secondary | ICD-10-CM | POA: Diagnosis not present

## 2014-07-31 DIAGNOSIS — M436 Torticollis: Secondary | ICD-10-CM | POA: Diagnosis not present

## 2014-07-31 DIAGNOSIS — Z79899 Other long term (current) drug therapy: Secondary | ICD-10-CM | POA: Insufficient documentation

## 2014-07-31 DIAGNOSIS — K219 Gastro-esophageal reflux disease without esophagitis: Secondary | ICD-10-CM | POA: Insufficient documentation

## 2014-07-31 DIAGNOSIS — Z720 Tobacco use: Secondary | ICD-10-CM | POA: Diagnosis not present

## 2014-07-31 DIAGNOSIS — M542 Cervicalgia: Secondary | ICD-10-CM | POA: Diagnosis present

## 2014-07-31 HISTORY — DX: Gastro-esophageal reflux disease without esophagitis: K21.9

## 2014-07-31 MED ORDER — METHOCARBAMOL 500 MG PO TABS: 500.0000 mg | ORAL_TABLET | Freq: Three times a day (TID) | ORAL | Status: DC | PRN

## 2014-07-31 MED ORDER — CYCLOBENZAPRINE HCL 10 MG PO TABS
5.0000 mg | ORAL_TABLET | Freq: Once | ORAL | Status: AC
  Administered 2014-07-31: 5 mg via ORAL
  Filled 2014-07-31: qty 1

## 2014-07-31 NOTE — Discharge Instructions (Signed)

## 2014-07-31 NOTE — ED Notes (Signed)
Patient woke up this morning with left sided neck pain, states he is unsure if he slept on it wrong. States 4 months ago he had an u/s of his neck due to severe acid reflux and a nodule was discovered. Pt states he is here today because he is concerned that nodule may have gotten larger

## 2014-07-31 NOTE — ED Provider Notes (Signed)
CSN: [id number]     Arrival date & time 07/31/14  1125 History   First MD Initiated Contact with Patient 07/31/14 1145     Chief Complaint  Patient presents with  . Neck Pain      HPI  Patient presents evaluation of neck pain and concern over "nodule in my neck". He waking this morning with pain and stiffness in the left side of his neck. He states he's been sleeping upright last days because he been having a lot of heartburn. He waking this morning and opposition upon getting up and moving around symptoms have improved. He became concerned because he was seeing an ENT for evaluation of dysphasia last year and had an ultrasound of his thyroid. This showed 11 mm nodule.  Past Medical History  Diagnosis Date  . Acid reflux disease    Past Surgical History  Procedure Laterality Date  . Circumcision    . Esophagogastroduodenoscopy     No family history on file. History  Substance Use Topics  . Smoking status: Current Every Day Smoker -- 0.50 packs/day    Types: Cigarettes  . Smokeless tobacco: Never Used  . Alcohol Use: Yes     Comment: occasional    Review of Systems  Constitutional: Negative for fever, chills, diaphoresis, appetite change and fatigue.  HENT: Negative for mouth sores, sore throat and trouble swallowing.   Eyes: Negative for visual disturbance.  Respiratory: Negative for cough, chest tightness, shortness of breath and wheezing.   Cardiovascular: Negative for chest pain.  Gastrointestinal: Negative for nausea, vomiting, abdominal pain, diarrhea and abdominal distention.  Endocrine: Negative for polydipsia, polyphagia and polyuria.  Genitourinary: Negative for dysuria, frequency and hematuria.  Musculoskeletal: Positive for neck pain. Negative for gait problem.  Skin: Negative for color change, pallor and rash.  Neurological: Negative for dizziness, syncope, light-headedness and headaches.  Hematological: Does not bruise/bleed easily.  Psychiatric/Behavioral:  Negative for behavioral problems and confusion.      Allergies  Review of patient's allergies indicates no known allergies.  Home Medications   Prior to Admission medications   Medication Sig Start Date End Date Taking? Authorizing Provider  omeprazole (PRILOSEC) 20 MG capsule Take 20 mg by mouth daily.   Yes Historical Provider, MD  ranitidine (ZANTAC) 150 MG tablet Take 150 mg by mouth 2 (two) times daily.   Yes Historical Provider, MD  benzonatate (TESSALON) 200 MG capsule Take 1 capsule (200 mg total) by mouth 2 (two) times daily as needed for cough. 02/21/14   Purvis SheffieldForrest Harrison, MD  cephALEXin (KEFLEX) 500 MG capsule Take 1 capsule (500 mg total) by mouth 4 (four) times daily. 06/18/14   Elson AreasLeslie K Sofia, PA-C  fluticasone [facility](FLONASE) 50 MCG/ACT nasal spray Place 2 sprays into both nostrils daily. 02/21/14   Purvis SheffieldForrest Harrison, MD  HYDROcodone-homatropine [facility](HYCODAN) 5-1.5 MG/5ML syrup Take 5 mLs by mouth every 6 (six) hours as needed for cough. 02/04/14   Trixie DredgeEmily West, PA-C  hydrOXYzine (ATARAX/VISTARIL) 25 MG tablet Take 1-2 tablets (25-50 mg total) by mouth every 6 (six) hours as needed for itching (may cause drowsiness). 04/10/14   Carlisle BeersJohn L Molpus, MD  loratadine (CLARITIN) 10 MG tablet Take 1 tablet (10 mg total) by mouth daily. 02/21/14   Purvis SheffieldForrest Harrison, MD  tadalafil (CIALIS) 5 MG tablet Take 5 mg by mouth daily as needed.    Historical Provider, MD   BP 146/90 mmHg  Pulse 83  Temp(Src) 98 F (36.7 C) (Oral)  Resp 18  Ht 5\' 8"  (  1.727 m)  Wt 197 lb (89.359 kg)  BMI 29.96 kg/m2  SpO2 98% Physical Exam  Constitutional: He is oriented to person, place, and time. He appears well-developed and well-nourished. No distress.  HENT:  Head: Normocephalic.  Eyes: Conjunctivae are normal. Pupils are equal, round, and reactive to light. No scleral icterus.  Neck: Normal range of motion. Neck supple. No thyromegaly present.    Cardiovascular: Normal rate and regular rhythm.  Exam reveals no gallop  and no friction rub.   No murmur heard. Pulmonary/Chest: Effort normal and breath sounds normal. No respiratory distress. He has no wheezes. He has no rales.  Abdominal: Soft. Bowel sounds are normal. He exhibits no distension. There is no tenderness. There is no rebound.  Musculoskeletal: Normal range of motion.  Neurological: He is alert and oriented to person, place, and time.  Skin: Skin is warm and dry. No rash noted.  Psychiatric: He has a normal mood and affect. His behavior is normal.    ED Course  Procedures (including critical care time) Labs Review Labs Reviewed - No data to display  Imaging Review No results found.   EKG Interpretation None      MDM   Final diagnoses:  None    History consistent with torticollis. Benign exam. Thyroid not enlarged no palpable mass asymmetry nodular pain. Has pain reproduced with musculoskeletal maneuvers. Plan is muscle accident, anti-inflammatory, ice or heat, recheck as needed.  I spent a great of time discussing his previous I would not do with him. He does continue to smoke. Therefore I recommended he follow-up with his ENT for further evaluation regarding any repeat of this benign appearing nodule per radiology report.   Rolland Porter, MD 07/31/14 1247

## 2014-09-04 ENCOUNTER — Encounter (HOSPITAL_BASED_OUTPATIENT_CLINIC_OR_DEPARTMENT_OTHER): Payer: Self-pay | Admitting: *Deleted

## 2014-09-04 DIAGNOSIS — E041 Nontoxic single thyroid nodule: Secondary | ICD-10-CM | POA: Diagnosis not present

## 2014-09-04 DIAGNOSIS — Z720 Tobacco use: Secondary | ICD-10-CM | POA: Diagnosis not present

## 2014-09-04 DIAGNOSIS — F419 Anxiety disorder, unspecified: Secondary | ICD-10-CM | POA: Insufficient documentation

## 2014-09-04 NOTE — ED Notes (Signed)
Pt sts he had an ultrasound in Nov and they found a nodule on his thyroid. Pt followed up with his PMD who told him they would do a repeat US in 1 year. Pt sts he is extremely stressed out over the issue and would like another US.

## 2014-09-05 ENCOUNTER — Emergency Department (HOSPITAL_BASED_OUTPATIENT_CLINIC_OR_DEPARTMENT_OTHER)
Admission: EM | Admit: 2014-09-05 | Discharge: 2014-09-05 | Discharge status: Against Medical Advice | Payer: BLUE CROSS/BLUE SHIELD | Attending: Emergency Medicine | Admitting: Emergency Medicine

## 2014-09-05 ENCOUNTER — Encounter (HOSPITAL_BASED_OUTPATIENT_CLINIC_OR_DEPARTMENT_OTHER): Payer: Self-pay

## 2014-09-05 ENCOUNTER — Emergency Department (HOSPITAL_BASED_OUTPATIENT_CLINIC_OR_DEPARTMENT_OTHER): Payer: BLUE CROSS/BLUE SHIELD

## 2014-09-05 ENCOUNTER — Emergency Department (HOSPITAL_BASED_OUTPATIENT_CLINIC_OR_DEPARTMENT_OTHER)
Admission: EM | Admit: 2014-09-05 | Discharge: 2014-09-05 | Disposition: A | Discharge status: Home / Self Care | Payer: BLUE CROSS/BLUE SHIELD | Attending: Emergency Medicine | Admitting: Emergency Medicine

## 2014-09-05 DIAGNOSIS — K219 Gastro-esophageal reflux disease without esophagitis: Secondary | ICD-10-CM | POA: Insufficient documentation

## 2014-09-05 DIAGNOSIS — Z792 Long term (current) use of antibiotics: Secondary | ICD-10-CM | POA: Insufficient documentation

## 2014-09-05 DIAGNOSIS — E041 Nontoxic single thyroid nodule: Secondary | ICD-10-CM | POA: Diagnosis not present

## 2014-09-05 DIAGNOSIS — F419 Anxiety disorder, unspecified: Secondary | ICD-10-CM | POA: Diagnosis present

## 2014-09-05 DIAGNOSIS — Z720 Tobacco use: Secondary | ICD-10-CM | POA: Insufficient documentation

## 2014-09-05 DIAGNOSIS — Z7951 Long term (current) use of inhaled steroids: Secondary | ICD-10-CM | POA: Insufficient documentation

## 2014-09-05 DIAGNOSIS — Z79899 Other long term (current) drug therapy: Secondary | ICD-10-CM | POA: Insufficient documentation

## 2014-09-05 LAB — BASIC METABOLIC PANEL
ANION GAP: 0 — AB (ref 5–15)
BUN: 18 mg/dL (ref 6–23)
CALCIUM: 8.5 mg/dL (ref 8.4–10.5)
CHLORIDE: 112 mmol/L (ref 96–112)
CO2: 25 mmol/L (ref 19–32)
Creatinine, Ser: 1.02 mg/dL (ref 0.50–1.35)
GFR calc Af Amer: >90 mL/min (ref >90)
GFR calc non Af Amer: 86 mL/min — ABNORMAL LOW (ref >90)
GLUCOSE: 126 mg/dL — AB (ref 70–99)
Potassium: 3.9 mmol/L (ref 3.5–5.1)
SODIUM: 137 mmol/L (ref 135–145)

## 2014-09-05 LAB — TSH: TSH: 0.524 u[IU]/mL (ref 0.350–4.500)

## 2014-09-05 LAB — CBC WITH DIFFERENTIAL/PLATELET
BASOS PCT: 0 % (ref 0–1)
Basophils Absolute: 0 10*3/uL (ref 0.0–0.1)
EOS ABS: 0.2 10*3/uL (ref 0.0–0.7)
EOS PCT: 4 % (ref 0–5)
HEMATOCRIT: 42.3 % (ref 39.0–52.0)
HEMOGLOBIN: 14.8 g/dL (ref 13.0–17.0)
Lymphocytes Relative: 54 % — ABNORMAL HIGH (ref 12–46)
Lymphs Abs: 3 10*3/uL (ref 0.7–4.0)
MCH: 31.8 pg (ref 26.0–34.0)
MCHC: 35 g/dL (ref 30.0–36.0)
MCV: 91 fL (ref 78.0–100.0)
Monocytes Absolute: 0.5 10*3/uL (ref 0.1–1.0)
Monocytes Relative: 8 % (ref 3–12)
Neutro Abs: 1.9 10*3/uL (ref 1.7–7.7)
Neutrophils Relative %: 34 % — ABNORMAL LOW (ref 43–77)
Platelets: 224 10*3/uL (ref 150–400)
RBC: 4.65 MIL/uL (ref 4.22–5.81)
RDW: 12.6 % (ref 11.5–15.5)
WBC: 5.7 10*3/uL (ref 4.0–10.5)

## 2014-09-05 NOTE — ED Notes (Signed)
Pt sts US 4-5 months shows nodule on thyroid. Sts "I need that checked, I need that peace of mind." Reports chills and fever off and on. Pt sts "I've been praying, just check me."

## 2014-09-05 NOTE — ED Provider Notes (Signed)
CSN: [id number]     Arrival date & time 09/05/14  1736 History  This chart was scribe for No att. providers found by Angelene GiovanniEmmanuella Mensah, ED Scribe. The patient was seen in room MH12/MH12 and the patient's care was started at 6:44 PM.    Chief Complaint  Patient presents with  . Anxiety   The history is provided by the patient. No language interpreter was used.   HPI Comments: Timothy Franklin is a 49 y.o. male who presents to the Emergency Department for a US on his thyroid gland for a "peace of mind". He reports that his last US was done in November 2015 at [facility] and told to have another in 12 months because his nodes were too small. At the time his findings were that no discrete module were identified with a size of 6.7 x 2.6 x 3.0 cm. He was not given any thyroid medication. Currently, he reports associated fever. He denies trouble swallowing and neck soreness. He is here today because he would like to see the progression of his nodes to relieve his anxiety.    Past Medical History  Diagnosis Date  . Acid reflux disease    Past Surgical History  Procedure Laterality Date  . Circumcision    . Esophagogastroduodenoscopy     No family history on file. History  Substance Use Topics  . Smoking status: Current Every Day Smoker -- 0.50 packs/day    Types: Cigarettes  . Smokeless tobacco: Never Used  . Alcohol Use: Yes     Comment: occasional    Review of Systems  HENT: Negative for sore throat and trouble swallowing.   Psychiatric/Behavioral: The patient is nervous/anxious.   All other systems reviewed and are negative.     Allergies  Review of patient's allergies indicates no known allergies.  Home Medications   Prior to Admission medications   Medication Sig Start Date End Date Taking? Authorizing Provider  benzonatate (TESSALON) 200 MG capsule Take 1 capsule (200 mg total) by mouth 2 (two) times daily as needed for cough. 02/21/14   Purvis SheffieldForrest Harrison, MD  cephALEXin  (KEFLEX) 500 MG capsule Take 1 capsule (500 mg total) by mouth 4 (four) times daily. 06/18/14   Elson AreasLeslie K Sofia, PA-C  fluticasone [facility](FLONASE) 50 MCG/ACT nasal spray Place 2 sprays into both nostrils daily. 02/21/14   Purvis SheffieldForrest Harrison, MD  HYDROcodone-homatropine [facility](HYCODAN) 5-1.5 MG/5ML syrup Take 5 mLs by mouth every 6 (six) hours as needed for cough. 02/04/14   Trixie DredgeEmily West, PA-C  hydrOXYzine (ATARAX/VISTARIL) 25 MG tablet Take 1-2 tablets (25-50 mg total) by mouth every 6 (six) hours as needed for itching (may cause drowsiness). 04/10/14   Carlisle BeersJohn L Molpus, MD  loratadine (CLARITIN) 10 MG tablet Take 1 tablet (10 mg total) by mouth daily. 02/21/14   Purvis SheffieldForrest Harrison, MD  methocarbamol (ROBAXIN) 500 MG tablet Take 1 tablet (500 mg total) by mouth 3 (three) times daily between meals as needed. 07/31/14   Rolland PorterMark James, MD  omeprazole (PRILOSEC) 20 MG capsule Take 20 mg by mouth daily.    Historical Provider, MD  ranitidine (ZANTAC) 150 MG tablet Take 150 mg by mouth 2 (two) times daily.    Historical Provider, MD  tadalafil (CIALIS) 5 MG tablet Take 5 mg by mouth daily as needed.    Historical Provider, MD   BP 127/70 mmHg  Pulse 56  Temp(Src) 97.4 F (36.3 C) (Oral)  Resp 16  Ht 5\' 7"  (1.702 m)  SpO2 98% Physical  Exam  Constitutional: He is oriented to person, place, and time. He appears well-developed and well-nourished. No distress.  HENT:  Head: Normocephalic and atraumatic.  Mouth/Throat: Oropharynx is clear and moist. No oropharyngeal exudate.  Eyes: Conjunctivae and EOM are normal. Pupils are equal, round, and reactive to light.  Neck: Normal range of motion. Neck supple. Thyromegaly present.  No meningismus.  Cardiovascular: Normal rate, regular rhythm, normal heart sounds and intact distal pulses.   No murmur heard. Pulmonary/Chest: Effort normal and breath sounds normal. No stridor. No respiratory distress.  Abdominal: Soft. There is no tenderness. There is no rebound and no guarding.   Musculoskeletal: Normal range of motion. He exhibits no edema or tenderness.  Neurological: He is alert and oriented to person, place, and time. No cranial nerve deficit. He exhibits normal muscle tone. Coordination normal.  No ataxia on finger to nose bilaterally. No pronator drift. 5/5 strength throughout. CN 2-12 intact. Negative Romberg. Equal grip strength. Sensation intact. Gait is normal.   Skin: Skin is warm.  Psychiatric: His behavior is normal.  Appears anxious  Nursing note and vitals reviewed.   ED Course  Procedures (including critical care time) DIAGNOSTIC STUDIES: Oxygen Saturation is 98% on RA, normal by my interpretation.    COORDINATION OF CARE: 6:50 PM- Pt advised of plan for treatment and pt agrees.    Labs Review Labs Reviewed  CBC WITH DIFFERENTIAL/PLATELET - Abnormal; Notable for the following:    Neutrophils Relative % 34 (*)    Lymphocytes Relative 54 (*)    All other components within normal limits  BASIC METABOLIC PANEL - Abnormal; Notable for the following:    Glucose, Bld 126 (*)    GFR calc non Af Amer 86 (*)    Anion gap 0 (*)    All other components within normal limits  TSH  T4, FREE    Imaging Review US Soft Tissue Head/neck  09/05/2014   CLINICAL DATA:  Nodule left lobe of thyroid by prior ultrasound 05/27/2014. Follow-up examination.  EXAM: THYROID ULTRASOUND  TECHNIQUE: Ultrasound examination of the thyroid gland and adjacent soft tissues was performed.  COMPARISON:  Thyroid ultrasound 05/27/2014.  FINDINGS: Right thyroid lobe  Measurements: 6.6 x 2.4 x 2.9 cm. A single nodule in the right lobe inferiorly measuring 1 cm in diameter is seen posteriorly. In retrospect, this is present on the prior examination is not notably changed. It is better visualized today.  Left thyroid lobe  Measurements: 6.1 x 2.5 x 2.8 cm. A nodule in the inferior pole which had measured 1.0 x 1.1 x 0.7 cm is again seen measuring 0.9 x 0.8 x 1.2 cm. Differences in  measurement is likely due to scanning plane. The nodule is unchanged in appearance. A second tiny nodule in the midpole measuring 0.5 cm is not visualized on prior exam. No microcalcifications or other worrisome features are identified.  Isthmus  Thickness: 0.5 cm.  No nodules visualized.  Lymphadenopathy  None visualized.  IMPRESSION: Nodule in the lower pole of the lower pole of the left thyroid is unchanged. Second 1 cm nodule in the lower pole of the right thyroid is likely present on the prior examination also unchanged. A 0.5 cm nodule in the midpole on the left is not visualized on prior study possibly due to scanning technique. No specific features to suggest malignancy are identified in any of these nodules. As on the prior examination, followup its study in 1 year to ensure stability is recommended.   Electronically  Signed   By: Drusilla Kanner M.D.   On: 09/05/2014 20:34     EKG Interpretation None      MDM   Final diagnoses:  Thyroid nodule  Anxiety   Patient with history of thyroid nodule in November. He was recommended for reevaluation one year. He is anxious and stating he wants another ultrasound because he is worried about it.  Explained to patient that nodules not big enough on previous ultrasound to be biopsied. Advised him that his insurance may not pay for repeat ultrasound. Advised him that it would not change our management in the ED today.  He is insistent that he needs he ultrasound.  Thyroid ultrasound unchanged. TSH pending.  Patient informed he needs to follow-up with his ENT doctor as previously scheduled.  I personally performed the services described in this documentation, which was scribed in my presence. The recorded information has been reviewed and is accurate.   Glynn Octave, MD 09/05/14 [phone]

## 2014-09-05 NOTE — Discharge Instructions (Signed)
Thyroid Cyst Follow up for repeat ultrasound in 1 year. Return to the ED if you develop new or worsening symptoms. The thyroid gland is a butterfly-shaped gland in the middle of the neck, located just below the voice box. It makes thyroid hormone. Thyroid hormone has an effect on nearly all tissues of your body by regulating your metabolism. Metabolism is the breakdown and use of food that you eat or energy that is stored in your body. Your metabolism affects your heart rate, blood pressure, body temperature, and weight. Thyroid cysts are enlarged fluid filled regions of the thyroid gland. These cysts range in size and may expand and enlarge suddenly. Rapidly expanding cysts may cause pain, difficulty swallowing, and rarely, difficulty breathing. Most cysts of the thyroid are not cancerous (benign). SYMPTOMS Bleeding may occur within the cyst. If the bleeding is severe, the cyst may get larger and produce problems in the neck, including swelling that may produce pain and difficultly swallowing. If the vocal cords are compressed, hoarseness may occur. If the windpipe is compressed, you may have difficulty breathing. DIAGNOSIS  A thyroid cyst is diagnosed through physical exam. The diagnosis can be confirmed by an ultrasound exam of the neck. This creates a picture by bouncing sound waves off the thyroid gland. Sometimes the cysts are drained using a fine needle. The fluid is then sent to the lab where it can be examined. This is done to see if any cells in the fluid are cancerous. If they are found to be cancerous, you will need further treatment.  TREATMENT  If the fluid in your neck does not show evidence of cancer, your caregiver may just want to monitor you with yearly ultrasound exams. Sometimes cysts need to be removed surgically. Document Released: 05/28/2004 Document Revised: 09/27/2011 Document Reviewed: 09/10/2010 [facility] Patient Information 2015 [place] [place]. This information is not  intended to replace advice given to you by your health care provider. Make sure you discuss any questions you have with your health care provider.

## 2014-09-05 NOTE — ED Notes (Signed)
MD at bedside. 

## 2014-09-06 LAB — T4, FREE: FREE T4: 1.03 ng/dL (ref 0.80–1.80)

## 2014-09-20 ENCOUNTER — Telehealth (HOSPITAL_BASED_OUTPATIENT_CLINIC_OR_DEPARTMENT_OTHER): Payer: Self-pay | Admitting: Emergency Medicine

## 2014-09-28 ENCOUNTER — Emergency Department (HOSPITAL_BASED_OUTPATIENT_CLINIC_OR_DEPARTMENT_OTHER)
Admission: EM | Admit: 2014-09-28 | Discharge: 2014-09-28 | Disposition: A | Discharge status: Home / Self Care | Payer: BLUE CROSS/BLUE SHIELD | Attending: Emergency Medicine | Admitting: Emergency Medicine

## 2014-09-28 ENCOUNTER — Encounter (HOSPITAL_BASED_OUTPATIENT_CLINIC_OR_DEPARTMENT_OTHER): Payer: Self-pay

## 2014-09-28 DIAGNOSIS — Z720 Tobacco use: Secondary | ICD-10-CM | POA: Insufficient documentation

## 2014-09-28 DIAGNOSIS — K148 Other diseases of tongue: Secondary | ICD-10-CM | POA: Diagnosis present

## 2014-09-28 DIAGNOSIS — K143 Hypertrophy of tongue papillae: Secondary | ICD-10-CM | POA: Diagnosis not present

## 2014-09-28 DIAGNOSIS — K219 Gastro-esophageal reflux disease without esophagitis: Secondary | ICD-10-CM | POA: Diagnosis not present

## 2014-09-28 DIAGNOSIS — Z79899 Other long term (current) drug therapy: Secondary | ICD-10-CM | POA: Diagnosis not present

## 2014-09-28 NOTE — ED Provider Notes (Signed)
CSN: [id number]     Arrival date & time 09/28/14  1033 History   First MD Initiated Contact with Patient 09/28/14 1200     Chief Complaint  Patient presents with  . bumps on tongue     HPI Comments: 49 year old male presents in the ED with bumps on his tongue. He reports today while driving in the car he looked at his tongue and noticed multiple raised red bumps on his tongue. He reports bumps are most likely due to him brushing his tongue this morning to wanted to be further evaluated. He denies difficulty breathing, sore throat, tongue swelling, trouble swallowing drooling or any other pain. No recent systemic symptoms including fever chest pain nausea vomiting abdominal pain, neck stiffness.    Past Medical History  Diagnosis Date  . Acid reflux disease    Past Surgical History  Procedure Laterality Date  . Circumcision    . Esophagogastroduodenoscopy     No family history on file. History  Substance Use Topics  . Smoking status: Current Every Day Smoker -- 0.50 packs/day    Types: Cigarettes  . Smokeless tobacco: Never Used  . Alcohol Use: Yes     Comment: occasional    Review of Systems  All other systems reviewed and are negative.   Allergies  Review of patient's allergies indicates no known allergies.  Home Medications   Prior to Admission medications   Medication Sig Start Date End Date Taking? Authorizing Provider  omeprazole (PRILOSEC) 20 MG capsule Take 20 mg by mouth daily.    Historical Provider, MD  ranitidine (ZANTAC) 150 MG tablet Take 150 mg by mouth 2 (two) times daily.    Historical Provider, MD  tadalafil (CIALIS) 5 MG tablet Take 5 mg by mouth daily as needed.    Historical Provider, MD   BP 144/79 mmHg  Pulse 64  Temp(Src) 98.2 F (36.8 C) (Oral)  Resp 18  Ht 5\' 8"  (1.727 m)  Wt 198 lb (89.812 kg)  BMI 30.11 kg/m2  SpO2 100% Physical Exam  Constitutional: He is oriented to person, place, and time. He appears well-developed and  well-nourished.  HENT:  Head: Normocephalic and atraumatic.  Mouth/Throat: Uvula is midline, oropharynx is clear and moist and mucous membranes are normal. He does not have dentures. No oral lesions. No trismus in the jaw. Normal dentition. No dental abscesses, uvula swelling, lacerations or dental caries. No oropharyngeal exudate, posterior oropharyngeal edema, posterior oropharyngeal erythema or tonsillar abscesses.  Multiple raised papillae to posterior tongue. No swelling  Eyes: Conjunctivae are normal. Pupils are equal, round, and reactive to light. Right eye exhibits no discharge. Left eye exhibits no discharge. No scleral icterus.  Neck: Normal range of motion. No JVD present. No tracheal deviation present.  Cardiovascular: Normal rate, regular rhythm and normal heart sounds.   Pulmonary/Chest: Effort normal and breath sounds normal. No accessory muscle usage or stridor. No respiratory distress.  Neurological: He is alert and oriented to person, place, and time. Coordination normal.  Psychiatric: He has a normal mood and affect. His behavior is normal. Judgment and thought content normal.  Nursing note and vitals reviewed.   ED Course  Procedures (including critical care time) Labs Review Labs Reviewed - No data to display  Imaging Review No results found.   EKG Interpretation None     MDM   Final diagnoses:  Tongue papillae hypertrophy    Patient's presentation most likely raised papilla of the tongue. He showed no signs of respiratory respiratory  compromise difficulty handling oral secretions or any other complaints. Patient was discharged home with instructions to avoid spicy foods, hot liquids and to follow up with PCP as needed if symptoms do not improve. Patient advised to return for immediate medical care he experiences difficulty breathing swelling of the throat tongue drooling or pain. He understood the plan and agree.     Eyvonne Mechanic, PA-C 09/28/14  1301  Derwood Kaplan, MD 09/28/14 [id number]

## 2014-09-28 NOTE — ED Notes (Signed)
Patient here with 2 red raised bumps on right side of tongue. No complaints with same

## 2014-09-28 NOTE — Discharge Instructions (Signed)
Dental Care and Dentist Visits Dental care supports good overall health. Regular dental visits can also help you avoid dental pain, bleeding, infection, and other more serious health problems in the future. It is important to keep the mouth healthy because diseases in the teeth, gums, and other oral tissues can spread to other areas of the body. Some problems, such as diabetes, heart disease, and pre-term labor have been associated with poor oral health.  See your dentist every 6 months. If you experience emergency problems such as a toothache or broken tooth, go to the dentist right away. If you see your dentist regularly, you may catch problems early. It is easier to be treated for problems in the early stages.  WHAT TO EXPECT AT A DENTIST VISIT  Your dentist will look for many common oral health problems and recommend proper treatment. At your regular dental visit, you can expect:  Gentle cleaning of the teeth and gums. This includes scraping and polishing. This helps to remove the sticky substance around the teeth and gums (plaque). Plaque forms in the mouth shortly after eating. Over time, plaque hardens on the teeth as tartar. If tartar is not removed regularly, it can cause problems. Cleaning also helps remove stains.  Periodic X-rays. These pictures of the teeth and supporting bone will help your dentist assess the health of your teeth.  Periodic fluoride treatments. Fluoride is a natural mineral shown to help strengthen teeth. Fluoride treatmentinvolves applying a fluoride gel or varnish to the teeth. It is most commonly done in children.  Examination of the mouth, tongue, jaws, teeth, and gums to look for any oral health problems, such as:  Cavities (dental caries). This is decay on the tooth caused by plaque, sugar, and acid in the mouth. It is best to catch a cavity when it is small.  Inflammation of the gums caused by plaque buildup (gingivitis).  Problems with the mouth or malformed  or misaligned teeth.  Oral cancer or other diseases of the soft tissues or jaws. KEEP YOUR TEETH AND GUMS HEALTHY For healthy teeth and gums, follow these general guidelines as well as your dentist's specific advice:  Have your teeth professionally cleaned at the dentist every 6 months.  Brush twice daily with a fluoride toothpaste.  Floss your teeth daily.  Ask your dentist if you need fluoride supplements, treatments, or fluoride toothpaste.  Eat a healthy diet. Reduce foods and drinks with added sugar.  Avoid smoking. TREATMENT FOR ORAL HEALTH PROBLEMS If you have oral health problems, treatment varies depending on the conditions present in your teeth and gums.  Your caregiver will most likely recommend good oral hygiene at each visit.  For cavities, gingivitis, or other oral health disease, your caregiver will perform a procedure to treat the problem. This is typically done at a separate appointment. Sometimes your caregiver will refer you to another dental specialist for specific tooth problems or for surgery. SEEK IMMEDIATE DENTAL CARE IF:  You have pain, bleeding, or soreness in the gum, tooth, jaw, or mouth area.  A permanent tooth becomes loose or separated from the gum socket.  You experience a blow or injury to the mouth or jaw area. Document Released: 03/17/2011 Document Revised: 09/27/2011 Document Reviewed: 03/17/2011 [facility] Patient Information 2015 [place], [place]. This information is not intended to replace advice given to you by your health care provider. Make sure you discuss any questions you have with your health care provider.  Patient is advised to follow-up with his PCP  if condition does not improve. Please refrain from brushing tongue drinking hot liquids or eating spicy foods until symptoms resolve. Any difficulty breathing swallowing or pain please return for further evaluation.

## 2014-10-10 ENCOUNTER — Encounter (HOSPITAL_BASED_OUTPATIENT_CLINIC_OR_DEPARTMENT_OTHER): Payer: Self-pay | Admitting: *Deleted

## 2014-10-10 ENCOUNTER — Emergency Department (HOSPITAL_BASED_OUTPATIENT_CLINIC_OR_DEPARTMENT_OTHER)
Admission: EM | Admit: 2014-10-10 | Discharge: 2014-10-10 | Disposition: A | Discharge status: Home / Self Care | Payer: BLUE CROSS/BLUE SHIELD | Attending: Emergency Medicine | Admitting: Emergency Medicine

## 2014-10-10 DIAGNOSIS — Z720 Tobacco use: Secondary | ICD-10-CM | POA: Diagnosis not present

## 2014-10-10 DIAGNOSIS — K1379 Other lesions of oral mucosa: Secondary | ICD-10-CM | POA: Diagnosis not present

## 2014-10-10 DIAGNOSIS — K219 Gastro-esophageal reflux disease without esophagitis: Secondary | ICD-10-CM | POA: Diagnosis not present

## 2014-10-10 DIAGNOSIS — Z79899 Other long term (current) drug therapy: Secondary | ICD-10-CM | POA: Diagnosis not present

## 2014-10-10 DIAGNOSIS — K1370 Unspecified lesions of oral mucosa: Secondary | ICD-10-CM

## 2014-10-10 NOTE — ED Provider Notes (Signed)
CSN: [id number]     Arrival date & time 10/10/14  2101 History  This chart was scribed for Timothy Crease, MD by Roxy Cedar, ED Scribe. This patient was seen in room MHT13/MHT13 and the patient's care was started at 10:15 PM.   Chief Complaint  Patient presents with  . Mouth Lesions   Patient is a 49 y.o. male presenting with mouth sores. The history is provided by the patient. No language interpreter was used.  Mouth Lesions Location:  Tongue Onset quality:  Gradual Severity:  Moderate Progression:  Unchanged Chronicity:  Recurrent Relieved by:  Nothing Worsened by:  Nothing tried Ineffective treatments:  None tried  HPI Comments: Jonovan Boedecker is a 49 y.o. male with a PMHx of acid reflux disease, who presents to the Emergency Department complaining of "bumpts on the left side of my tongue and at the very back". Patient was seen last week for similar symptoms. Patient states that he takes Metrazol and Zantac for acid reflux.  Past Medical History  Diagnosis Date  . Acid reflux disease    Past Surgical History  Procedure Laterality Date  . Circumcision    . Esophagogastroduodenoscopy     No family history on file. History  Substance Use Topics  . Smoking status: Current Every Day Smoker -- 0.50 packs/day    Types: Cigarettes  . Smokeless tobacco: Never Used  . Alcohol Use: Yes     Comment: occasional   Review of Systems  HENT: Positive for mouth sores.   All other systems reviewed and are negative.  Allergies  Review of patient's allergies indicates no known allergies.  Home Medications   Prior to Admission medications   Medication Sig Start Date End Date Taking? Authorizing Provider  omeprazole (PRILOSEC) 20 MG capsule Take 20 mg by mouth daily.    Historical Provider, MD  ranitidine (ZANTAC) 150 MG tablet Take 150 mg by mouth 2 (two) times daily.    Historical Provider, MD  tadalafil (CIALIS) 5 MG tablet Take 5 mg by mouth daily as needed.     Historical Provider, MD   Triage Vitals: BP 138/73 mmHg  Pulse 66  Temp(Src) 98.7 F (37.1 C) (Oral)  Resp 20  Ht  (1.727 m)  Wt 198 lb (89.812 kg)  BMI 30.11 kg/m2  SpO2 100%  Physical Exam  Constitutional: He is oriented to person, place, and time. He appears well-developed and well-nourished. No distress.  HENT:  Head: Normocephalic and atraumatic.  Right Ear: Hearing normal.  Left Ear: Hearing normal.  Nose: Nose normal.  Mouth/Throat: Oropharynx is clear and moist and mucous membranes are normal.  Eyes: Conjunctivae and EOM are normal. Pupils are equal, round, and reactive to light.  Neck: Normal range of motion. Neck supple.  Cardiovascular: Regular rhythm, S1 normal and S2 normal.  Exam reveals no gallop and no friction rub.   No murmur heard. Pulmonary/Chest: Effort normal and breath sounds normal. No respiratory distress. He exhibits no tenderness.  Abdominal: Soft. Normal appearance and bowel sounds are normal. There is no hepatosplenomegaly. There is no tenderness. There is no rebound, no guarding, no tenderness at McBurney's point and negative Murphy's sign. No hernia.  Musculoskeletal: Normal range of motion.  Neurological: He is alert and oriented to person, place, and time. He has normal strength. No cranial nerve deficit or sensory deficit. Coordination normal. GCS eye subscore is 4. GCS verbal subscore is 5. GCS motor subscore is 6.  Skin: Skin is warm, dry and intact.  No rash noted. No cyanosis.  Psychiatric: He has a normal mood and affect. His speech is normal and behavior is normal. Thought content normal.  Nursing note and vitals reviewed.  ED Course  Procedures (including critical care time)  DIAGNOSTIC STUDIES: Oxygen Saturation is 100% on RA, normal by my interpretation.    COORDINATION OF CARE: 10:18 PM- Examined patient's tongue. Advised patient to contact ENT specialist if symptoms worsen. Will discharge patient. Pt advised of plan for treatment  and pt agrees.  Labs Review Labs Reviewed - No data to display  Imaging Review No results found.   EKG Interpretation None     MDM   Final diagnoses:  None  worried well  Patient has noticed bumps on the back of his tongue. He is concerned that this might be related to oral sex. Patient was counseled that the lesions that are noted on the back of his tongue normal valleculae. He does not have any suspicious lesions. No intervention is necessary.   I personally performed the services described in this documentation, which was scribed in my presence. The recorded information has been reviewed and is accurate.     Timothy Creasehristopher J Pollina, MD 10/10/14 2226

## 2014-10-10 NOTE — ED Notes (Signed)
States he has bumps on the sides of his tongue. He was seen for same last week he was here for bumps on top of his tongue.

## 2014-10-19 ENCOUNTER — Emergency Department (HOSPITAL_BASED_OUTPATIENT_CLINIC_OR_DEPARTMENT_OTHER)
Admission: EM | Admit: 2014-10-19 | Discharge: 2014-10-19 | Disposition: A | Discharge status: Home / Self Care | Payer: BLUE CROSS/BLUE SHIELD | Attending: Emergency Medicine | Admitting: Emergency Medicine

## 2014-10-19 ENCOUNTER — Encounter (HOSPITAL_BASED_OUTPATIENT_CLINIC_OR_DEPARTMENT_OTHER): Payer: Self-pay | Admitting: *Deleted

## 2014-10-19 DIAGNOSIS — F419 Anxiety disorder, unspecified: Secondary | ICD-10-CM | POA: Insufficient documentation

## 2014-10-19 DIAGNOSIS — Z0000 Encounter for general adult medical examination without abnormal findings: Secondary | ICD-10-CM | POA: Insufficient documentation

## 2014-10-19 DIAGNOSIS — Z720 Tobacco use: Secondary | ICD-10-CM | POA: Diagnosis not present

## 2014-10-19 DIAGNOSIS — K148 Other diseases of tongue: Secondary | ICD-10-CM | POA: Diagnosis present

## 2014-10-19 DIAGNOSIS — Z79899 Other long term (current) drug therapy: Secondary | ICD-10-CM | POA: Insufficient documentation

## 2014-10-19 DIAGNOSIS — K219 Gastro-esophageal reflux disease without esophagitis: Secondary | ICD-10-CM | POA: Insufficient documentation

## 2014-10-19 NOTE — ED Provider Notes (Signed)
CSN: [id number]     Arrival date & time 10/19/14  1334 History   First MD Initiated Contact with Patient 10/19/14 1442     Chief Complaint  Patient presents with  . Abscess     (Consider location/radiation/quality/duration/timing/severity/associated sxs/prior Treatment) HPI Timothy Franklin is a 49 y.o. male with hx of acid reflux, presents to ED with complaint of brown discolorations on the tongue. Patient states he was seen here a few days ago for bumps on the tongue and was told it was normal bumps. States now he looked in his mouth and there was a brown spot to the right side of the tongue. He denies any pain. No injuries. No fever or chills. He does not take any medications daily. No other complaints. States when he is solid and made him anxious and he just wanted to get it checked out.  Past Medical History  Diagnosis Date  . Acid reflux disease    Past Surgical History  Procedure Laterality Date  . Circumcision    . Esophagogastroduodenoscopy     No family history on file. History  Substance Use Topics  . Smoking status: Current Every Day Smoker -- 0.50 packs/day    Types: Cigarettes  . Smokeless tobacco: Never Used  . Alcohol Use: Yes     Comment: occasional    Review of Systems  Constitutional: Negative for fever and chills.  HENT: Negative for mouth sores and sore throat.   Respiratory: Negative for cough, chest tightness and shortness of breath.   Cardiovascular: Negative for chest pain, palpitations and leg swelling.  Genitourinary: Negative for urgency.  Musculoskeletal: Negative for myalgias.  Skin: Negative for rash.  Allergic/Immunologic: Negative for immunocompromised state.  Neurological: Negative for dizziness, weakness, light-headedness, numbness and headaches.      Allergies  Review of patient's allergies indicates no known allergies.  Home Medications   Prior to Admission medications   Medication Sig Start Date End Date Taking? Authorizing  Provider  omeprazole (PRILOSEC) 20 MG capsule Take 20 mg by mouth daily.   Yes Historical Provider, MD  ranitidine (ZANTAC) 150 MG tablet Take 150 mg by mouth 2 (two) times daily.   Yes Historical Provider, MD  tadalafil (CIALIS) 5 MG tablet Take 5 mg by mouth daily as needed.   Yes Historical Provider, MD   BP 123/83 mmHg  Pulse 60  Temp(Src) 98.4 F (36.9 C) (Oral)  Resp 18  Ht  (1.727 m)  Wt 198 lb (89.812 kg)  BMI 30.11 kg/m2  SpO2 100% Physical Exam  Constitutional: He appears well-developed and well-nourished. No distress.  HENT:  Head: Normocephalic and atraumatic.  Normal appearing tongue with no abnormal bumps or discoloration  Eyes: Conjunctivae are normal.  Neck: Neck supple.  Cardiovascular: Normal rate, regular rhythm and normal heart sounds.   Pulmonary/Chest: Effort normal. No respiratory distress. He has no wheezes. He has no rales.  Musculoskeletal: He exhibits no edema.  Neurological: He is alert.  Skin: Skin is warm and dry.  Nursing note and vitals reviewed.   ED Course  Procedures (including critical care time) Labs Review Labs Reviewed - No data to display  Imaging Review No results found.   EKG Interpretation None      MDM   Final diagnoses:  Normal physical examination   Pt with normal tongue examination. No lesions. No discoloration. Home with PCP follow up.   Filed Vitals:   10/19/14 1346  BP: 123/83  Pulse: 60  Temp: 98.4 F (36.9  C)  TempSrc: Oral  Resp: 18  Height: 5\' 8"  (1.727 m)  Weight: 198 lb (89.812 kg)  SpO2: 100%       Jaynie Crumbleatyana Mylinh Cragg, PA-C 10/19/14 2331  Elwin MochaBlair Walden, MD 10/20/14 1524

## 2014-10-19 NOTE — ED Notes (Signed)
C/o right side of tongue has brown spot on it. Denies swelling. Noticed this am. No other sx. Has been seen previously for same.

## 2014-10-19 NOTE — Discharge Instructions (Signed)
Your tongue appears normal on exam. Follow up with your doctor as needed.

## 2014-11-09 DIAGNOSIS — K219 Gastro-esophageal reflux disease without esophagitis: Secondary | ICD-10-CM | POA: Insufficient documentation

## 2014-11-09 DIAGNOSIS — Z720 Tobacco use: Secondary | ICD-10-CM | POA: Insufficient documentation

## 2014-11-09 DIAGNOSIS — Z0000 Encounter for general adult medical examination without abnormal findings: Secondary | ICD-10-CM | POA: Insufficient documentation

## 2014-11-09 DIAGNOSIS — Z79899 Other long term (current) drug therapy: Secondary | ICD-10-CM | POA: Diagnosis not present

## 2014-11-09 DIAGNOSIS — E049 Nontoxic goiter, unspecified: Secondary | ICD-10-CM | POA: Diagnosis present

## 2014-11-10 ENCOUNTER — Encounter (HOSPITAL_BASED_OUTPATIENT_CLINIC_OR_DEPARTMENT_OTHER): Payer: Self-pay | Admitting: *Deleted

## 2014-11-10 ENCOUNTER — Emergency Department (HOSPITAL_BASED_OUTPATIENT_CLINIC_OR_DEPARTMENT_OTHER)
Admission: EM | Admit: 2014-11-10 | Discharge: 2014-11-10 | Disposition: A | Discharge status: Home / Self Care | Payer: BLUE CROSS/BLUE SHIELD | Attending: Emergency Medicine | Admitting: Emergency Medicine

## 2014-11-10 DIAGNOSIS — Z0000 Encounter for general adult medical examination without abnormal findings: Secondary | ICD-10-CM

## 2014-11-10 HISTORY — DX: Nontoxic single thyroid nodule: E04.1

## 2014-11-10 NOTE — ED Provider Notes (Signed)
CSN: [id number]     Arrival date & time 11/09/14  2357 History   First MD Initiated Contact with Patient 11/10/14 0148     Chief Complaint  Patient presents with  . Thyroid Nodule     (Consider location/radiation/quality/duration/timing/severity/associated sxs/prior Treatment) HPI Patient with history of anxiety presents with concern for possible thyroid nodule. States he was leaving the gym and began feeling his neck and was concerned that he felt a nodule on his thyroid. He denies any difficulty swallowing or shortness of breath. There is no pain at the site. He's had no fever or chills. Past Medical History  Diagnosis Date  . Acid reflux disease   . Thyroid nodule     pt reports this was found on him 05/2014   Past Surgical History  Procedure Laterality Date  . Circumcision    . Esophagogastroduodenoscopy     No family history on file. History  Substance Use Topics  . Smoking status: Current Every Day Smoker -- 0.50 packs/day    Types: Cigarettes  . Smokeless tobacco: Never Used  . Alcohol Use: Yes     Comment: occasional    Review of Systems  Constitutional: Negative for fever and chills.  HENT: Negative for sore throat, trouble swallowing and voice change.   Respiratory: Negative for shortness of breath, wheezing and stridor.   Gastrointestinal: Negative for nausea and vomiting.  Musculoskeletal: Negative for neck pain and neck stiffness.  Skin: Negative for rash and wound.  All other systems reviewed and are negative.     Allergies  Review of patient's allergies indicates no known allergies.  Home Medications   Prior to Admission medications   Medication Sig Start Date End Date Taking? Authorizing Provider  omeprazole (PRILOSEC) 20 MG capsule Take 20 mg by mouth daily.    Historical Provider, MD  ranitidine (ZANTAC) 150 MG tablet Take 150 mg by mouth 2 (two) times daily.    Historical Provider, MD  tadalafil (CIALIS) 5 MG tablet Take 5 mg by mouth daily as  needed.    Historical Provider, MD   BP 138/78 mmHg  Pulse 50  Temp(Src) 98.1 F (36.7 C) (Oral)  Resp 20  Ht  (1.727 m)  Wt 202 lb (91.627 kg)  BMI 30.72 kg/m2  SpO2 100% Physical Exam  Constitutional: He is oriented to person, place, and time. He appears well-developed and well-nourished. No distress.  HENT:  Head: Normocephalic and atraumatic.  Mouth/Throat: Oropharynx is clear and moist. No oropharyngeal exudate.  Eyes: EOM are normal. Pupils are equal, round, and reactive to light.  Neck: Normal range of motion. Neck supple. No JVD present. No tracheal deviation present. No thyromegaly present.  The area the patient is concerned about corresponds to tracheal rings. No appreciated thyromegaly or thyroid masses. No tenderness  Cardiovascular: Normal rate and regular rhythm.   Pulmonary/Chest: Effort normal and breath sounds normal. No stridor. No respiratory distress. He has no wheezes. He has no rales.  Abdominal: Soft. Bowel sounds are normal.  Musculoskeletal: Normal range of motion. He exhibits no edema or tenderness.  Lymphadenopathy:    He has no cervical adenopathy.  Neurological: He is alert and oriented to person, place, and time.  Skin: Skin is warm and dry. No rash noted. No erythema.  Psychiatric: He has a normal mood and affect. His behavior is normal.  Nursing note and vitals reviewed.   ED Course  Procedures (including critical care time) Labs Review Labs Reviewed - No data to display  Imaging Review No results found.   EKG Interpretation None      MDM   Final diagnoses:  Normal physical exam    Patient reassured by normal physical exam.    Loren Raceravid Valdis Bevill, MD 11/10/14 [phone]

## 2014-11-10 NOTE — ED Notes (Signed)
Pt is here as in the past they found a thyroid nodule which was too small to treat and he frequently checks his thyroid as its something that worries him and he wants to make sure everyting is ok, he asked me to palpate the area and I do not feel anything unusual.

## 2014-12-26 ENCOUNTER — Encounter (HOSPITAL_BASED_OUTPATIENT_CLINIC_OR_DEPARTMENT_OTHER): Payer: Self-pay | Admitting: *Deleted

## 2014-12-26 ENCOUNTER — Emergency Department (HOSPITAL_BASED_OUTPATIENT_CLINIC_OR_DEPARTMENT_OTHER)
Admission: EM | Admit: 2014-12-26 | Discharge: 2014-12-27 | Disposition: A | Discharge status: Home / Self Care | Payer: BLUE CROSS/BLUE SHIELD | Attending: Emergency Medicine | Admitting: Emergency Medicine

## 2014-12-26 ENCOUNTER — Emergency Department (HOSPITAL_BASED_OUTPATIENT_CLINIC_OR_DEPARTMENT_OTHER): Payer: BLUE CROSS/BLUE SHIELD

## 2014-12-26 DIAGNOSIS — Z720 Tobacco use: Secondary | ICD-10-CM | POA: Insufficient documentation

## 2014-12-26 DIAGNOSIS — E041 Nontoxic single thyroid nodule: Secondary | ICD-10-CM | POA: Diagnosis not present

## 2014-12-26 DIAGNOSIS — K219 Gastro-esophageal reflux disease without esophagitis: Secondary | ICD-10-CM | POA: Insufficient documentation

## 2014-12-26 DIAGNOSIS — R221 Localized swelling, mass and lump, neck: Secondary | ICD-10-CM

## 2014-12-26 DIAGNOSIS — Z79899 Other long term (current) drug therapy: Secondary | ICD-10-CM | POA: Diagnosis not present

## 2014-12-26 DIAGNOSIS — M542 Cervicalgia: Secondary | ICD-10-CM | POA: Diagnosis present

## 2014-12-26 NOTE — ED Provider Notes (Signed)
CSN: [id number]     Arrival date & time 12/26/14  2257 History  This chart was scribed for Geoffery Lyons, MD by Octavia Heir, ED Scribe. This patient was seen in room MH06/MH06 and the patient's care was started at 11:15 PM.    Chief Complaint  Patient presents with  . Neck Pain     The history is provided by the patient. No language interpreter was used.    HPI Comments: Timothy Franklin is a 49 y.o. male who has a PMHx of thyroid nodule presents to the Emergency Department complaining of a swollen neck on the right side onset this evening. He reports not having much pain in his neck, he just noticed the swelling. Pt notes having a thyroid nodule found on his thyroid 05/2014 and states that he has been advised to watched for certain symptoms, which includes swelling of the neck. Pt notes having pain in his neck 2 weeks ago at the gym. Pt denies palpations, trouble swallowing, shortness of breath, weight loss, and weight gain.   Past Medical History  Diagnosis Date  . Acid reflux disease   . Thyroid nodule     pt reports this was found on him 05/2014   Past Surgical History  Procedure Laterality Date  . Circumcision    . Esophagogastroduodenoscopy     History reviewed. No pertinent family history. History  Substance Use Topics  . Smoking status: Current Every Day Smoker -- 0.50 packs/day    Types: Cigarettes  . Smokeless tobacco: Never Used  . Alcohol Use: Yes     Comment: occasional    Review of Systems  A complete 10 system review of systems was obtained and all systems are negative except as noted in the HPI and PMH.    Allergies  Review of patient's allergies indicates no known allergies.  Home Medications   Prior to Admission medications   Medication Sig Start Date End Date Taking? Authorizing Provider  omeprazole (PRILOSEC) 20 MG capsule Take 20 mg by mouth daily.    Historical Provider, MD  ranitidine (ZANTAC) 150 MG tablet Take 150 mg by mouth 2 (two) times daily.     Historical Provider, MD  tadalafil (CIALIS) 5 MG tablet Take 5 mg by mouth daily as needed.    Historical Provider, MD   Triage vitals: BP 131/86 mmHg  Pulse 61  Temp(Src) 97.9 F (36.6 C) (Oral)  Resp 16  Ht 5\' 8"  (1.727 m)  SpO2 98% Physical Exam  Constitutional: He is oriented to person, place, and time. He appears well-developed and well-nourished. No distress.  HENT:  Head: Normocephalic.  Eyes: Conjunctivae are normal. Pupils are equal, round, and reactive to light. No scleral icterus.  Neck: Normal range of motion. Neck supple. No tracheal deviation present. No thyromegaly present.  There is no significant thyromegaly or mass palpable  Cardiovascular: Normal rate and regular rhythm.  Exam reveals no gallop and no friction rub.   No murmur heard. Pulmonary/Chest: Effort normal and breath sounds normal. No stridor. No respiratory distress. He has no wheezes. He has no rales.  Abdominal: Soft. Bowel sounds are normal. He exhibits no distension. There is no tenderness. There is no rebound.  Musculoskeletal: Normal range of motion.  Lymphadenopathy:    He has no cervical adenopathy.  Neurological: He is alert and oriented to person, place, and time. No cranial nerve deficit. Coordination normal.  Skin: Skin is warm and dry. No rash noted.  Psychiatric: He has a normal mood and  affect. His behavior is normal.  Nursing note and vitals reviewed.   ED Course  Procedures  DIAGNOSTIC STUDIES: Oxygen Saturation is 98% on RA, normal by my interpretation.  COORDINATION OF CARE:  11:20 PM Discussed treatment plan which includes ultrasound if possible with pt at bedside and pt agreed to plan.   Labs Review Labs Reviewed - No data to display  Imaging Review No results found.   EKG Interpretation None      MDM   Final diagnoses:  None    Ultrasound reveals a slight increase in the size of the thyroid nodule, the significance of which is uncertain. This will need to be  further evaluated in the ENT office. The patient states that he has an ENT that he has seen and I will recommend him follow up with them.  I personally performed the services described in this documentation, which was scribed in my presence. The recorded information has been reviewed and is accurate.      Geoffery Lyons, MD 12/27/14 [phone]

## 2014-12-26 NOTE — ED Notes (Signed)
Noticed swelling to rt side of neck this pm,  States has hx of thyroid mass

## 2014-12-26 NOTE — ED Notes (Signed)
Pt c/o neck pain and swelling x 1 day

## 2014-12-27 NOTE — Discharge Instructions (Signed)
Follow-up with your ENT for further evaluation.   Thyroid Cyst The thyroid gland is a butterfly-shaped gland in the middle of the neck, located just below the voice box. It makes thyroid hormone. Thyroid hormone has an effect on nearly all tissues of your body by regulating your metabolism. Metabolism is the breakdown and use of food that you eat or energy that is stored in your body. Your metabolism affects your heart rate, blood pressure, body temperature, and weight. Thyroid cysts are enlarged fluid filled regions of the thyroid gland. These cysts range in size and may expand and enlarge suddenly. Rapidly expanding cysts may cause pain, difficulty swallowing, and rarely, difficulty breathing. Most cysts of the thyroid are not cancerous (benign). SYMPTOMS Bleeding may occur within the cyst. If the bleeding is severe, the cyst may get larger and produce problems in the neck, including swelling that may produce pain and difficultly swallowing. If the vocal cords are compressed, hoarseness may occur. If the windpipe is compressed, you may have difficulty breathing. DIAGNOSIS  A thyroid cyst is diagnosed through physical exam. The diagnosis can be confirmed by an ultrasound exam of the neck. This creates a picture by bouncing sound waves off the thyroid gland. Sometimes the cysts are drained using a fine needle. The fluid is then sent to the lab where it can be examined. This is done to see if any cells in the fluid are cancerous. If they are found to be cancerous, you will need further treatment.  TREATMENT  If the fluid in your neck does not show evidence of cancer, your caregiver may just want to monitor you with yearly ultrasound exams. Sometimes cysts need to be removed surgically. Document Released: 05/28/2004 Document Revised: 09/27/2011 Document Reviewed: 09/10/2010 [facility] Patient Information 2015 [place], [place]. This information is not intended to replace advice given to you by your health  care provider. Make sure you discuss any questions you have with your health care provider.

## 2015-03-09 ENCOUNTER — Encounter (HOSPITAL_BASED_OUTPATIENT_CLINIC_OR_DEPARTMENT_OTHER): Payer: Self-pay | Admitting: Emergency Medicine

## 2015-03-09 ENCOUNTER — Emergency Department (HOSPITAL_BASED_OUTPATIENT_CLINIC_OR_DEPARTMENT_OTHER)
Admission: EM | Admit: 2015-03-09 | Discharge: 2015-03-09 | Disposition: A | Discharge status: Home / Self Care | Payer: BLUE CROSS/BLUE SHIELD | Attending: Emergency Medicine | Admitting: Emergency Medicine

## 2015-03-09 DIAGNOSIS — K219 Gastro-esophageal reflux disease without esophagitis: Secondary | ICD-10-CM | POA: Insufficient documentation

## 2015-03-09 DIAGNOSIS — Z79899 Other long term (current) drug therapy: Secondary | ICD-10-CM | POA: Insufficient documentation

## 2015-03-09 DIAGNOSIS — Z720 Tobacco use: Secondary | ICD-10-CM | POA: Diagnosis not present

## 2015-03-09 DIAGNOSIS — Z8639 Personal history of other endocrine, nutritional and metabolic disease: Secondary | ICD-10-CM | POA: Insufficient documentation

## 2015-03-09 DIAGNOSIS — J3489 Other specified disorders of nose and nasal sinuses: Secondary | ICD-10-CM

## 2015-03-09 DIAGNOSIS — R51 Headache: Secondary | ICD-10-CM | POA: Diagnosis present

## 2015-03-09 NOTE — Discharge Instructions (Signed)
Use nasal saline (you can try [employer] Hammer Simply Saline) at least 4 times a day, use saline 5-10 minutes before using the fluticasone (flonase) nasal spray ° °Do not use Afrin (Oxymetazoline) ° °Rest, wash hands frequently  and drink plenty of water. ° °You may try counter medication such as Mucinex or Sudafed decongestant. ° °Do not hesitate to return to the emergency room for any new, worsening or concerning symptoms. ° °Please obtain primary care using resource guide below. Let them know that you were seen in the emergency room and that they will need to obtain records for further outpatient management. ° ° ° °Emergency Department Resource Guide °1) Find a Doctor and Pay Out of Pocket °Although you won't have to find out who is covered by your insurance plan, it is a good idea to ask around and get recommendations. You will then need to call the office and see if the doctor you have chosen will accept you as a new patient and what types of options they offer for patients who are self-pay. Some doctors offer discounts or will set up payment plans for their patients who do not have insurance, but you will need to ask so you aren't surprised when you get to your appointment. ° °2) Contact Your Local Health Department °Not all health departments have doctors that can see patients for sick visits, but many do, so it is worth a call to see if yours does. If you don't know where your local health department is, you can check in your phone book. The CDC also has a tool to help you locate your state's health department, and many state websites also have listings of all of their local health departments. ° °3) Find a Walk-in Clinic °If your illness is not likely to be very severe or complicated, you may want to try a walk in clinic. These are popping up all over the country in pharmacies, drugstores, and shopping centers. They're usually staffed by nurse practitioners or physician assistants that have been trained to  treat common illnesses and complaints. They're usually fairly quick and inexpensive. However, if you have serious medical issues or chronic medical problems, these are probably not your best option. ° °No Primary Care Doctor: °- Call [facility] at  832-8000 - they can help you locate a primary care doctor that  accepts your insurance, provides certain services, etc. °- Physician Referral Service- 1-800-533-3463 ° °Chronic Pain Problems: °Organization         Address  Phone   Notes  °[facility]  (336) 297-2271 Patients need to be referred by their primary care doctor.  ° °Medication Assistance: °Organization         Address  Phone   Notes  °[place] [facility] [address] °[place], [place] [address] (336) 641-8030 --Must be a resident of [place] °-- Must have NO insurance coverage whatsoever (no Medicaid/ Medicare, etc.) °-- The pt. MUST have a primary care doctor that directs their care regularly and follows them in the community °  °[facility]  (866) 331-1348   °[employer]  (888) 892-1162   ° °Agencies that provide inexpensive medical care: °Organization         Address  Phone   Notes  °[facility]  (336) 832-8035   °[facility]    (336) 832-7272   °[facility] [address] °[place], [place] [address] (336) 832-4777   °  [facility] [address], °[place] (336) 271-4999   °[facility]    (336) 373-0678   °[facility]    (336) 272-1050   °[facility] ° [address], [place] Phone:  (336) 832-4444, Fax:  (336) 832-4440 Hours of Operation:  9 am - 6 pm, M-F.  Also accepts Medicaid/Medicare and self-pay.  °[facility] ° [address], [place] Phone: (336) 832-3150, Fax: (336) 832-3151. Hours of Operation:  8:30 am - 5:30 pm, M-F.  Also accepts Medicaid and self-pay.  °[facility]  [place] [address], [place] Phone: (336) 878-6027   °[facility] [address], [place], [place] (336)723-1848, Ext. 123 Mondays & Thursdays: 7-9 AM.  First 15 patients are seen on a first come, first serve basis. °  ° °Medicaid-accepting [place] Providers: ° °Organization         Address  Phone   Notes  °[facility] [address], [place] (336) 641-2100 Also accepts self-pay patients.  °[facility] [address], [place] ° (336) 856-9996   °[facility] [address], [place] (336) 288-8857   °[facility] [address] [place] [address], [place] (336) 299-7000   °Veita Bland [address], [place]  ° (336) 373-1557 Only accepts [place] Access Medicaid patients after they have their name applied to their card.  ° °Self-Pay (no insurance) in [place]: ° °Organization         Address  Phone   Notes  °Sickle Cell Patients, [facility] [address], [place] (336) 832-1970   °[facility] [address], [place] (336) 832-4400   °[facility] ° [address], [place] (336) 992-4800   °[facility]/Dr. Osei-Bonsu ° [address] [place] [address], [place] or [address], [place] (336) 841-8500 Phone number for both [place] and [place] locations is the same.  °[facility] [address], [place] (336) 299-0000   °[facility] [address] [place] [address], [place] or [address] (336) 852-7530 °(336) 878-2260   °[facility] [address],  (336) 350-1642, phone; (336) 294-5005, fax Sees patients 1st and 3rd Saturday of every month.  Must not qualify for public or private insurance (i.e. Medicaid, Medicare, [place] Health Choice, Veterans' Benefits) • Household income should be no more than 200%  of the poverty level •The clinic cannot treat you if you are pregnant or think you are pregnant • Sexually transmitted diseases are not treated at the clinic.  ° ° °Dental Care: °Organization         Address  Phone  Notes  °[facility] [address],  (336) 641-6152 Accepts children up to age 21 who are enrolled in Medicaid or [place] Health Choice; pregnant women with a Medicaid card; and children who have applied for Medicaid or [place] Health Choice, but were declined, whose parents can pay a reduced fee at time of service.  °[facility] [place]  [address], [place] (336) 641-7733 Accepts children up to age 21 who are enrolled in Medicaid or  Health Choice; pregnant women with a Medicaid card; and   children who have applied for Medicaid or [place] Health Choice, but were declined, whose parents can pay a reduced fee at time of service.  °[facility] ° [address], [place] (336) 641-4533 Patients are seen by appointment only. Walk-ins are not accepted. [facility] will see patients 18 years of age and older. °Monday - Tuesday (8am-5pm) °Most Wednesdays (8:30-5pm) °$30 per visit, cash only  °[facility] ° [address], [place] (336) 641-4533 Patients are seen by appointment only. Walk-ins are not accepted. [facility] will see patients 18 years of age and older. °One Wednesday Evening (Monthly: Volunteer Based).  $30 per visit, cash only  °[facility]  (919) 537-3737 for adults; Children under age 4, call Graduate Pediatric Dentistry at (919) [phone]. Children aged 4-14, please call (919) [phone] to request a pediatric application. ° Dental services are provided in all areas of dental care including fillings, crowns and bridges, complete and partial dentures, implants, gum treatment, root canals, and  extractions. Preventive care is also provided. Treatment is provided to both adults and children. °Patients are selected via a lottery and there is often a waiting list. °  °[facility] [address], °[place] ° (336) 763-8833 [email or web address] °  °[facility] [address], [place], [place] (336)723-1848, Ext. 123 Second and Fourth Thursday of each month, opens at 6:30 AM; Clinic ends at 9 AM.  Patients are seen on a first-come first-served basis, and a limited number are seen during each clinic.  ° °[facility] ° [address], [place], [place] (336) 723-7904   Eligibility Requirements °You must have lived in [place], [place], or [place] counties for at least the last three months. °  You cannot be eligible for state or federal sponsored healthcare insurance, including Veterans Administration, Medicaid, or Medicare. °  You generally cannot be eligible for healthcare insurance through your employer.  °  How to apply: °Eligibility screenings are held every Tuesday and Wednesday afternoon from 1:00 pm until 4:00 pm. You do not need an appointment for the interview!  °[facility] [address], [place], [place] 336-631-2330   °[facility]  336-342-8273   °[facility]  336-703-3100   °[facility]  336-570-6415   ° °Behavioral Health Resources in the Community: °Intensive Outpatient Programs °Organization         Address  Phone  Notes  °[facility] [address], [place], [place] 336-878-6098   °[facility] [address], [place], [place] 336-832-9800   °[facility] [address], [place], [place] ° 336-882-2125   °[facility] [address],  °[place], [place] 1-800-853-5163 or 336-641-4981   °Substance Abuse Resources °Organization         Address  Phone  Notes  °[facility]  336-882-2125     °[facility]  336-784-9470   °[facility]  336-285-9073   °[facility]  336-845-3988   °Residential & Outpatient Substance Abuse Program  1-800-659-3381   °Psychological Services °Organization         Address  Phone  Notes  °[facility]  336- 832-9600   °[facility]  336- 378-7881   °[facility] [address], [place] 1-800-853-5163 or 336-641-4981   ° °Mobile Crisis Teams °Organization           Address  Phone  Notes  °[facility], Mobile Crisis Care Unit  1-877-626-1772   °Assertive °[facility] ° [address] [place], [place] 336-834-9664   °Sharon DeEsch [address] °[place] 336-554-5454   ° °Self-Help/Support Groups °Organization         Address  Phone             Notes  °[facility] - variety of support groups  336- 373-1402 Call for more information  °Narcotics Anonymous (NA), [facility] [address], °[place]  2 meetings at this location  ° °Residential Treatment Programs °Organization         Address  Phone  Notes  °[facility] [address],    °[place]  1-866-801-8205   °[facility] ° [address], [place], [place] 704-293-8524   °[facility] [address], [place] 336-845-3988 Admissions: 8am-3pm M-F  °[facility] [address]    °[place], [place] 336-841-1104   °[facility] [address], [place], [place] 336-379-7146   °[facility] [address]  °[place], [place] 336-285-9073   °[facility] - Intensive Outpatient [address], [place], [place] 336-852-3033   °[facility] [address]  °[place], [place] 1-877-615-2722 or 336-784-9470   °[facility] [address] [place], [place] 336-227-7417 Accepts Medicaid  °[facility] [address]   °[place] 1-800-659-3381 Substance Abuse/Addiction Treatment  ° °[place] Behavioral Health Resources °Organization         Address  Phone  Notes  °[facility]  (888) 581-9988   °Julie Brannon, PhD [address] [place], [place]   (336) 349-5553 or (336) 951-0000   °[facility]   [address] °[place], [place] (336) 349-4454   °[facility] [address], [place], [place] (336) 342-8316 Insurance/Medicaid/sponsorship through [facility]  °[facility] [address]                                    [place], [place] (336) 342-8316 Therapy/tele-psych/case  °[facility] [address]  ° [place], [place] (336) 349-2233    °Dr. Arfeen  (336) 349-4544   °[facility] [place]  [employer] [place] [facility] 1) [address], [place] °2) [address], [place] °3)  [address], [place] (336) 349-3220 °(336) 342-7768 ° °(336) 342-8140   °[place] [facility] (336) 342-1394 or (336) 342-3537 (After Hours)    ° ° ° °

## 2015-03-09 NOTE — ED Provider Notes (Signed)
CSN: [id number]     Arrival date & time 03/09/15  2136 History   First MD Initiated Contact with Patient 03/09/15 2204     Chief Complaint  Patient presents with  . Facial Pain     (Consider location/radiation/quality/duration/timing/severity/associated sxs/prior Treatment) HPI   Blood pressure 131/69, pulse 52, temperature 98.2 F (36.8 C), temperature source Oral, resp. rate 16, height  (1.727 m), weight 200 lb (90.719 kg), SpO2 100 %.  Coty Chisenhall is a 49 y.o. male complaining of  right maxillary sinus pressure onset this morning associated with nasal congestion. Patient denies fever, chills, cough, otalgia, chest pain, shortness of breath, nausea, vomiting. Patient also states that he has thyroid nodules and would like them evaluated.   Past Medical History  Diagnosis Date  . Acid reflux disease   . Thyroid nodule     pt reports this was found on him 05/2014   Past Surgical History  Procedure Laterality Date  . Circumcision    . Esophagogastroduodenoscopy     History reviewed. No pertinent family history. Social History  Substance Use Topics  . Smoking status: Current Every Day Smoker -- 0.50 packs/day    Types: Cigarettes  . Smokeless tobacco: Never Used  . Alcohol Use: Yes     Comment: occasional    Review of Systems  10 systems reviewed and found to be negative, except as noted in the HPI.  Allergies  Review of patient's allergies indicates no known allergies.  Home Medications   Prior to Admission medications   Medication Sig Start Date End Date Taking? Authorizing Provider  omeprazole (PRILOSEC) 20 MG capsule Take 20 mg by mouth daily.    Historical Provider, MD  ranitidine (ZANTAC) 150 MG tablet Take 150 mg by mouth 2 (two) times daily.    Historical Provider, MD  tadalafil (CIALIS) 5 MG tablet Take 5 mg by mouth daily as needed.    Historical Provider, MD   BP 131/69 mmHg  Pulse 52  Temp(Src) 98.2 F (36.8 C) (Oral)  Resp 16  Ht   (1.727 m)  Wt 200 lb (90.719 kg)  BMI 30.42 kg/m2  SpO2 100% Physical Exam  Constitutional: He is oriented to person, place, and time. He appears well-developed and well-nourished. No distress.  HENT:  Head: Normocephalic and atraumatic.  Mouth/Throat: Oropharynx is clear and moist.  No drooling or stridor. Posterior pharynx mildly erythematous no significant tonsillar hypertrophy. No exudate. Soft palate rises symmetrically. No TTP or induration under tongue.   No tenderness to palpation of frontal or bilateral maxillary sinuses.  No mucosal edema in the nares.  Bilateral tympanic membranes with normal architecture and good light reflex.    Eyes: Conjunctivae and EOM are normal. Pupils are equal, round, and reactive to light.  Neck: Normal range of motion.  Cardiovascular: Normal rate, regular rhythm and intact distal pulses.   Pulmonary/Chest: Effort normal and breath sounds normal.  Abdominal: Soft. There is no tenderness.  Musculoskeletal: Normal range of motion.  Neurological: He is alert and oriented to person, place, and time.  Skin: He is not diaphoretic.  Psychiatric: He has a normal mood and affect.  Nursing note and vitals reviewed.   ED Course  Procedures (including critical care time) Labs Review Labs Reviewed - No data to display  Imaging Review No results found. I have personally reviewed and evaluated these images and lab results as part of my medical decision-making.   EKG Interpretation None      MDM  Final diagnoses:  Sinus pressure    Filed Vitals:   03/09/15 2146  BP: 131/69  Pulse: 52  Temp: 98.2 F (36.8 C)  TempSrc: Oral  Resp: 16  Height: 5\' 8"  (1.727 m)  Weight: 200 lb (90.719 kg)  SpO2: 100%    Jeancarlo Mclear is a pleasant 49 y.o. male presenting with sinus pressure, afebrile, no signs of sinus infection. Requesting evaluation for his thyroid nodules, and advised him he will need follow with his primary care closely on this.  Advise Flonase  Evaluation does not show pathology that would require ongoing emergent intervention or inpatient treatment. Pt is hemodynamically stable and mentating appropriately. Discussed findings and plan with patient/guardian, who agrees with care plan. All questions answered. Return precautions discussed and outpatient follow up given.     Wynetta Emery, PA-C 03/09/15 2224  Geoffery Lyons, MD 03/10/15 2258

## 2015-03-09 NOTE — ED Notes (Signed)
Patient reports sinus pain which began today.  Reports nasal drainage. Denies headache.

## 2015-10-05 ENCOUNTER — Encounter (HOSPITAL_BASED_OUTPATIENT_CLINIC_OR_DEPARTMENT_OTHER): Payer: Self-pay | Admitting: *Deleted

## 2015-10-05 ENCOUNTER — Emergency Department (HOSPITAL_BASED_OUTPATIENT_CLINIC_OR_DEPARTMENT_OTHER)
Admission: EM | Admit: 2015-10-05 | Discharge: 2015-10-06 | Disposition: A | Discharge status: Home / Self Care | Payer: BLUE CROSS/BLUE SHIELD | Attending: Emergency Medicine | Admitting: Emergency Medicine

## 2015-10-05 DIAGNOSIS — R0982 Postnasal drip: Secondary | ICD-10-CM | POA: Insufficient documentation

## 2015-10-05 DIAGNOSIS — F17210 Nicotine dependence, cigarettes, uncomplicated: Secondary | ICD-10-CM | POA: Insufficient documentation

## 2015-10-05 DIAGNOSIS — R0981 Nasal congestion: Secondary | ICD-10-CM | POA: Diagnosis not present

## 2015-10-05 NOTE — ED Notes (Signed)
3-4 days of sinus congestion and post nasal drip

## 2015-10-06 ENCOUNTER — Encounter (HOSPITAL_BASED_OUTPATIENT_CLINIC_OR_DEPARTMENT_OTHER): Payer: Self-pay | Admitting: Emergency Medicine

## 2015-10-06 ENCOUNTER — Emergency Department (HOSPITAL_BASED_OUTPATIENT_CLINIC_OR_DEPARTMENT_OTHER)
Admission: EM | Admit: 2015-10-06 | Discharge: 2015-10-06 | Disposition: A | Discharge status: Home / Self Care | Payer: BLUE CROSS/BLUE SHIELD | Source: Home / Self Care

## 2015-10-06 DIAGNOSIS — R0981 Nasal congestion: Secondary | ICD-10-CM

## 2015-10-06 DIAGNOSIS — R0982 Postnasal drip: Secondary | ICD-10-CM | POA: Insufficient documentation

## 2015-10-06 DIAGNOSIS — F17210 Nicotine dependence, cigarettes, uncomplicated: Secondary | ICD-10-CM

## 2015-10-06 NOTE — ED Notes (Signed)
Patient called x 3 not in the waiting room at this time

## 2015-10-06 NOTE — ED Notes (Signed)
3-4 days of sinus congestion and post nasal drip. Patient states that it is worse at night

## 2017-04-11 ENCOUNTER — Emergency Department (HOSPITAL_BASED_OUTPATIENT_CLINIC_OR_DEPARTMENT_OTHER)
Admission: EM | Admit: 2017-04-11 | Discharge: 2017-04-11 | Disposition: A | Discharge status: Home / Self Care | Payer: BLUE CROSS/BLUE SHIELD | Attending: Emergency Medicine | Admitting: Emergency Medicine

## 2017-04-11 ENCOUNTER — Encounter (HOSPITAL_BASED_OUTPATIENT_CLINIC_OR_DEPARTMENT_OTHER): Payer: Self-pay | Admitting: Emergency Medicine

## 2017-04-11 DIAGNOSIS — F17210 Nicotine dependence, cigarettes, uncomplicated: Secondary | ICD-10-CM | POA: Insufficient documentation

## 2017-04-11 DIAGNOSIS — Z79899 Other long term (current) drug therapy: Secondary | ICD-10-CM | POA: Diagnosis not present

## 2017-04-11 DIAGNOSIS — S29012A Strain of muscle and tendon of back wall of thorax, initial encounter: Secondary | ICD-10-CM | POA: Insufficient documentation

## 2017-04-11 DIAGNOSIS — S3992XA Unspecified injury of lower back, initial encounter: Secondary | ICD-10-CM | POA: Diagnosis present

## 2017-04-11 DIAGNOSIS — Y93B3 Activity, free weights: Secondary | ICD-10-CM | POA: Diagnosis not present

## 2017-04-11 DIAGNOSIS — X500XXA Overexertion from strenuous movement or load, initial encounter: Secondary | ICD-10-CM | POA: Diagnosis not present

## 2017-04-11 DIAGNOSIS — Y9259 Other trade areas as the place of occurrence of the external cause: Secondary | ICD-10-CM | POA: Insufficient documentation

## 2017-04-11 DIAGNOSIS — Y999 Unspecified external cause status: Secondary | ICD-10-CM | POA: Diagnosis not present

## 2017-04-11 DIAGNOSIS — T148XXA Other injury of unspecified body region, initial encounter: Secondary | ICD-10-CM

## 2017-04-11 MED ORDER — PREDNISONE 20 MG PO TABS: 40.0000 mg | ORAL_TABLET | Freq: Every day | ORAL | 0 refills | Status: AC

## 2017-04-11 MED ORDER — IBUPROFEN 600 MG PO TABS: 600.0000 mg | ORAL_TABLET | Freq: Three times a day (TID) | ORAL | 0 refills | Status: DC | PRN

## 2017-04-11 MED ORDER — CYCLOBENZAPRINE HCL 10 MG PO TABS: 10.0000 mg | ORAL_TABLET | Freq: Three times a day (TID) | ORAL | 0 refills | Status: DC | PRN

## 2017-04-11 MED FILL — CYCLOBENZAPRINE 10 MG TAB: 10 | 4 days supply | Qty: 12 | Fill #0

## 2017-04-11 MED FILL — IBUPROFEN 600 MG TABLET: 600 | 5 days supply | Qty: 15 | Fill #0

## 2017-04-11 MED FILL — predniSONE 20 MG TABS: 20 | 5 days supply | Qty: 10 | Fill #0

## 2017-04-11 NOTE — ED Triage Notes (Signed)
Upper back pain underneath the R scapula, radiating down R arm. Pain started following a shoulder workout. Pt also does heavy lifting at his job.

## 2017-04-11 NOTE — ED Provider Notes (Signed)
[facility] Provider Note   CSN: [id number] Arrival date & time: 04/11/17  4098     History   Chief Complaint Chief Complaint  Patient presents with  . Back Pain    HPI Timothy Franklin is a 51 y.o. male.  HPI Patient presents the emergency department with discomfort in his right medial scapular region that is worse with twisting and worse with certain positions.  He does do heavy lifting in the gym and does report that he was doing shrug type exercises and rolling his shoulders back.  Approximately 2 days later he began having discomfort and pain.  He denies weakness or numbness in his upper lower extremities.  He has some occasional paresthesias into his right arm.  No weakness of his grip strength.  No fevers or chills.  No cough or shortness of breath.  Symptoms are mild in severity.  Past Medical History:  Diagnosis Date  . Acid reflux disease   . Thyroid nodule    pt reports this was found on him 05/2014    There are no active problems to display for this patient.   Past Surgical History:  Procedure Laterality Date  . CIRCUMCISION    . ESOPHAGOGASTRODUODENOSCOPY         Home Medications    Prior to Admission medications   Medication Sig Start Date End Date Taking? Authorizing Provider  omeprazole (PRILOSEC) 20 MG capsule Take 20 mg by mouth daily.   Yes [provider]  ranitidine (ZANTAC) 150 MG tablet Take 150 mg by mouth 2 (two) times daily.   Yes [provider]  tadalafil (CIALIS) 5 MG tablet Take 5 mg by mouth daily as needed.   Yes [provider]  cyclobenzaprine (FLEXERIL) 10 MG tablet Take 1 tablet (10 mg total) by mouth 3 (three) times daily as needed for muscle spasms. 04/11/17   Azalia Bilis, MD  guaiFENesin (MUCINEX) 600 MG 12 hr tablet Take by mouth 2 (two) times daily.    [provider]  ibuprofen (ADVIL,MOTRIN) 600 MG tablet Take 1 tablet (600 mg total) by mouth every 8 (eight) hours as needed.  04/11/17   Azalia Bilis, MD  predniSONE (DELTASONE) 20 MG tablet Take 2 tablets (40 mg total) by mouth daily. 04/11/17 04/16/17  Azalia Bilis, MD    Family History No family history on file.  Social History Social History  Substance Use Topics  . Smoking status: Current Every Day Smoker    Packs/day: 0.50    Types: Cigarettes  . Smokeless tobacco: Never Used  . Alcohol use Yes     Comment: occasional     Allergies   Patient has no known allergies.   Review of Systems Review of Systems  All other systems reviewed and are negative.    Physical Exam Updated Vital Signs BP (!) 143/93 (BP Location: Right Arm)   Pulse (!) 58   Temp 98.3 F (36.8 C) (Oral)   Resp 18   Ht  (1.727 m)   Wt 91.2 kg (201 lb)   SpO2 100%   BMI 30.56 kg/m   Physical Exam  Constitutional: He is oriented to person, place, and time. He appears well-developed and well-nourished.  HENT:  Head: Normocephalic.  Eyes: EOM are normal.  Neck: Normal range of motion. No thyromegaly present.  Pulmonary/Chest: Effort normal.  Abdominal: He exhibits no distension.  Musculoskeletal: Normal range of motion.  No cervical or thoracic tenderness.  Mild right parathoracic tenderness without significant spasm.  Normal grip strength bilaterally.  Full range of motion bilateral shoulders, elbows, wrists.  No rash present across his thoracic region  Lymphadenopathy:    He has no cervical adenopathy.  Neurological: He is alert and oriented to person, place, and time.  Psychiatric: He has a normal mood and affect.  Nursing note and vitals reviewed.    ED Treatments / Results  Labs (all labs ordered are listed, but only abnormal results are displayed) Labs Reviewed - No data to display  EKG  EKG Interpretation None       Radiology No results found.  Procedures Procedures (including critical care time)  Medications Ordered in ED Medications - No data to display   Initial Impression /  Assessment and Plan / ED Course  I have reviewed the triage vital signs and the nursing notes.  Pertinent labs & imaging results that were available during my care of the patient were reviewed by me and considered in my medical decision making (see chart for details).     Patient is overall well-appearing.  Discharge home in good condition.  Likely muscle strain.  Final Clinical Impressions(s) / ED Diagnoses   Final diagnoses:  Muscle strain    New Prescriptions New Prescriptions   CYCLOBENZAPRINE (FLEXERIL) 10 MG TABLET    Take 1 tablet (10 mg total) by mouth 3 (three) times daily as needed for muscle spasms.   IBUPROFEN (ADVIL,MOTRIN) 600 MG TABLET    Take 1 tablet (600 mg total) by mouth every 8 (eight) hours as needed.   PREDNISONE (DELTASONE) 20 MG TABLET    Take 2 tablets (40 mg total) by mouth daily.     Azalia Bilis, MD 04/11/17 1040

## 2017-12-30 ENCOUNTER — Other Ambulatory Visit: Payer: Self-pay

## 2017-12-30 ENCOUNTER — Emergency Department (HOSPITAL_BASED_OUTPATIENT_CLINIC_OR_DEPARTMENT_OTHER)
Admission: EM | Admit: 2017-12-30 | Discharge: 2017-12-30 | Disposition: A | Discharge status: Home / Self Care | Payer: BLUE CROSS/BLUE SHIELD | Attending: Emergency Medicine | Admitting: Emergency Medicine

## 2017-12-30 ENCOUNTER — Encounter (HOSPITAL_BASED_OUTPATIENT_CLINIC_OR_DEPARTMENT_OTHER): Payer: Self-pay | Admitting: *Deleted

## 2017-12-30 DIAGNOSIS — F17210 Nicotine dependence, cigarettes, uncomplicated: Secondary | ICD-10-CM | POA: Insufficient documentation

## 2017-12-30 DIAGNOSIS — Z79899 Other long term (current) drug therapy: Secondary | ICD-10-CM | POA: Insufficient documentation

## 2017-12-30 DIAGNOSIS — M6281 Muscle weakness (generalized): Secondary | ICD-10-CM | POA: Diagnosis not present

## 2017-12-30 DIAGNOSIS — R5383 Other fatigue: Secondary | ICD-10-CM | POA: Insufficient documentation

## 2017-12-30 LAB — URINALYSIS, ROUTINE W REFLEX MICROSCOPIC
Bilirubin Urine: NEGATIVE
Glucose, UA: NEGATIVE mg/dL
Ketones, ur: NEGATIVE mg/dL
LEUKOCYTES UA: NEGATIVE
NITRITE: NEGATIVE
PH: 6 (ref 5.0–8.0)
Protein, ur: NEGATIVE mg/dL
Specific Gravity, Urine: <1.005 — ABNORMAL LOW (ref 1.005–1.030)

## 2017-12-30 LAB — CBC
HCT: 41.2 % (ref 39.0–52.0)
Hemoglobin: 14.8 g/dL (ref 13.0–17.0)
MCH: 33 pg (ref 26.0–34.0)
MCHC: 35.9 g/dL (ref 30.0–36.0)
MCV: 92 fL (ref 78.0–100.0)
Platelets: 190 10*3/uL (ref 150–400)
RBC: 4.48 MIL/uL (ref 4.22–5.81)
RDW: 13 % (ref 11.5–15.5)
WBC: 6.4 10*3/uL (ref 4.0–10.5)

## 2017-12-30 LAB — URINALYSIS, MICROSCOPIC (REFLEX)

## 2017-12-30 LAB — BASIC METABOLIC PANEL
Anion gap: 8 (ref 5–15)
BUN: 16 mg/dL (ref 6–20)
CALCIUM: 9 mg/dL (ref 8.9–10.3)
CHLORIDE: 105 mmol/L (ref 101–111)
CO2: 26 mmol/L (ref 22–32)
Creatinine, Ser: 1.19 mg/dL (ref 0.61–1.24)
GFR calc Af Amer: >60 mL/min (ref >60)
GFR calc non Af Amer: >60 mL/min (ref >60)
GLUCOSE: 90 mg/dL (ref 65–99)
Potassium: 3.8 mmol/L (ref 3.5–5.1)
Sodium: 139 mmol/L (ref 135–145)

## 2017-12-30 NOTE — ED Notes (Signed)
ED Provider at bedside. 

## 2017-12-30 NOTE — ED Provider Notes (Signed)
[facility] EMERGENCY DEPARTMENT Provider Note   CSN: [id number] Arrival date & time: 12/30/17  1839     History   Chief Complaint Chief Complaint  Patient presents with  . Check Up    HPI Timothy Franklin is a 52 y.o. male.  HPI Pt states he has a hard time explaining it.  A week or so ago, he had a strenous time at work.  He was not eating as well and he felt fatigued.  The next week the sx persisted.  He did not think he was resting well.  He has felt a little weak in his legs.  He feels "funny".  He does not have energy.   Nothing hurts.  No vomiting or diarrhea.  No fevers.  No chest pain or abdominal pain.   Pt is able to walk without difficulty.   Past Medical History:  Diagnosis Date  . Acid reflux disease   . Thyroid nodule    pt reports this was found on him 05/2014    There are no active problems to display for this patient.   Past Surgical History:  Procedure Laterality Date  . CIRCUMCISION    . ESOPHAGOGASTRODUODENOSCOPY          Home Medications    Prior to Admission medications   Medication Sig Start Date End Date Taking? Authorizing Provider  omeprazole (PRILOSEC) 20 MG capsule Take 20 mg by mouth daily.   Yes [provider]  cyclobenzaprine (FLEXERIL) 10 MG tablet Take 1 tablet (10 mg total) by mouth 3 (three) times daily as needed for muscle spasms. 04/11/17   Azalia Bilisampos, Kevin, MD  guaiFENesin (MUCINEX) 600 MG 12 hr tablet Take by mouth 2 (two) times daily.    [provider]  ibuprofen (ADVIL,MOTRIN) 600 MG tablet Take 1 tablet (600 mg total) by mouth every 8 (eight) hours as needed. 04/11/17   Azalia Bilisampos, Kevin, MD  ranitidine (ZANTAC) 150 MG tablet Take 150 mg by mouth 2 (two) times daily.    [provider]  tadalafil (CIALIS) 5 MG tablet Take 5 mg by mouth daily as needed.    [provider]    Family History No family history on file.  Social History Social History   Tobacco Use  . Smoking  status: Current Every Day Smoker    Packs/day: 0.50    Types: Cigarettes  . Smokeless tobacco: Never Used  Substance Use Topics  . Alcohol use: Yes    Comment: occasional  . Drug use: Yes    Frequency: 1.0 times per week    Types: Marijuana     Allergies   Patient has no known allergies.   Review of Systems Review of Systems  All other systems reviewed and are negative.    Physical Exam Updated Vital Signs BP 140/82   Pulse (!) 51   Temp 98.4 F (36.9 C) (Oral)   Resp 20   Ht 1.727 m (5\' 8" )   Wt 90.3 kg (199 lb)   SpO2 100%   BMI 30.26 kg/m   Physical Exam  Constitutional: He appears well-developed and well-nourished. No distress.  HENT:  Head: Normocephalic and atraumatic.  Right Ear: External ear normal.  Left Ear: External ear normal.  Eyes: Conjunctivae are normal. Right eye exhibits no discharge. Left eye exhibits no discharge. No scleral icterus.  Neck: Neck supple. No tracheal deviation present.  Cardiovascular: Normal rate, regular rhythm and intact distal pulses.  Pulmonary/Chest: Effort normal and breath sounds normal.  No stridor. No respiratory distress. He has no wheezes. He has no rales.  Abdominal: Soft. Bowel sounds are normal. He exhibits no distension. There is no tenderness. There is no rebound and no guarding.  Musculoskeletal: He exhibits no edema or tenderness.  Neurological: He is alert. He has normal strength. No cranial nerve deficit (no facial droop, extraocular movements intact, no slurred speech) or sensory deficit. He exhibits normal muscle tone. He displays no seizure activity. Coordination normal.  Skin: Skin is warm and dry. No rash noted.  Psychiatric: He has a normal mood and affect.  Nursing note and vitals reviewed.    ED Treatments / Results  Labs (all labs ordered are listed, but only abnormal results are displayed) Labs Reviewed  URINALYSIS, ROUTINE W REFLEX MICROSCOPIC - Abnormal; Notable for the following components:        Result Value   Color, Urine STRAW (*)    Specific Gravity, Urine <1.005 (*)    Hgb urine dipstick TRACE (*)    All other components within normal limits  URINALYSIS, MICROSCOPIC (REFLEX) - Abnormal; Notable for the following components:   Bacteria, UA RARE (*)    All other components within normal limits  CBC  BASIC METABOLIC PANEL    EKG EKG Interpretation  Date/Time:  Friday December 30 2017 19:48:06 EDT Ventricular Rate:  58 PR Interval:    QRS Duration: 101 QT Interval:  413 QTC Calculation: 406 R Axis:   49 Text Interpretation:  Sinus rhythm RSR' in V1 or V2, probably normal variant ST elev, probable normal early repol pattern No significant change since last tracing Confirmed by Linwood Dibbles [phone]) on 12/30/2017 7:50:56 PM   Radiology No results found.  Procedures Procedures (including critical care time)  Medications Ordered in ED Medications - No data to display   Initial Impression / Assessment and Plan / ED Course  I have reviewed the triage vital signs and the nursing notes.  Pertinent labs & imaging results that were available during my care of the patient were reviewed by me and considered in my medical decision making (see chart for details).   Patient presented to the emergency room for evaluation of fatigue.  Physical exam is reassuring.  Laboratory tests are unremarkable.  EKG is reassuring.  No clear etiology for the patient's fatigue however he seems to be in no distress and I think he can safely follow-up with his primary care doctor.  Final Clinical Impressions(s) / ED Diagnoses   Final diagnoses:  Other fatigue    ED Discharge Orders    None       Linwood Dibbles, MD 12/30/17 2032

## 2017-12-30 NOTE — Discharge Instructions (Addendum)
Follow-up with your primary care doctor next week if the symptoms persist

## 2017-12-30 NOTE — ED Notes (Signed)
Patient c/o fatigue over the past week; denies any other sxs at present.

## 2017-12-30 NOTE — ED Triage Notes (Signed)
States his stomach feels weak x 1 week.  He wants his "sugar" checked. States he may be exhausted. Keeps saying I really don't know what to say.

## 2018-01-05 ENCOUNTER — Encounter (HOSPITAL_BASED_OUTPATIENT_CLINIC_OR_DEPARTMENT_OTHER): Payer: Self-pay

## 2018-01-05 ENCOUNTER — Emergency Department (HOSPITAL_BASED_OUTPATIENT_CLINIC_OR_DEPARTMENT_OTHER)
Admission: EM | Admit: 2018-01-05 | Discharge: 2018-01-05 | Disposition: A | Discharge status: Home / Self Care | Payer: BLUE CROSS/BLUE SHIELD | Attending: Emergency Medicine | Admitting: Emergency Medicine

## 2018-01-05 ENCOUNTER — Other Ambulatory Visit: Payer: Self-pay

## 2018-01-05 DIAGNOSIS — L237 Allergic contact dermatitis due to plants, except food: Secondary | ICD-10-CM | POA: Diagnosis not present

## 2018-01-05 DIAGNOSIS — Z79899 Other long term (current) drug therapy: Secondary | ICD-10-CM | POA: Diagnosis not present

## 2018-01-05 DIAGNOSIS — R21 Rash and other nonspecific skin eruption: Secondary | ICD-10-CM | POA: Diagnosis present

## 2018-01-05 DIAGNOSIS — F17210 Nicotine dependence, cigarettes, uncomplicated: Secondary | ICD-10-CM | POA: Diagnosis not present

## 2018-01-05 MED ORDER — PREDNISONE 10 MG (21) PO TBPK: ORAL_TABLET | Freq: Every day | ORAL | 0 refills | Status: DC

## 2018-01-05 MED ORDER — PREDNISONE 10 MG PO TABS
60.0000 mg | ORAL_TABLET | Freq: Once | ORAL | Status: AC
  Administered 2018-01-05: 60 mg via ORAL
  Filled 2018-01-05: qty 1

## 2018-01-05 NOTE — Discharge Instructions (Signed)
Rash is likely due to poison ivy, please take steroid pack as directed.  Benadryl or Zyrtec as needed for itching as well as cool compresses.  Please use the phone number provided on your paperwork to establish a primary care doctor for follow-up, return for worsening rash, fevers or any other new or concerning symptoms.

## 2018-01-05 NOTE — ED Provider Notes (Signed)
[facility] EMERGENCY DEPARTMENT Provider Note   CSN: [id number] Arrival date & time: 01/05/18  2022     History   Chief Complaint Chief Complaint  Patient presents with  . Rash    HPI Timothy Franklin is a [age] y.o. male.  Timothy Franklin is a 52 y.o. Male with a history of reflux, who presents to the emergency department for evaluation of pruritic rash over bilateral forearms, hands and ankles.  Rash has been there for about 4 to 5 days.  Patient first noticed it after he was out working in the yard pulling weeds.  He reports rash is extremely itchy, did spread some at first but does not seem to have continued to spread he describes it as several raised bumps over the hands, arms and ankles.  Denies any drainage from the lesions.  No surrounding redness.  He denies any fevers or chills, no generalized malaise.  Patient is unsure if he specifically came in contact with poison ivy but reports he was not paying specific attention to this.  He denies any new household products, detergents, foods or medications.  No swelling of the face, no shortness of breath or difficulty breathing.     Past Medical History:  Diagnosis Date  . Acid reflux disease   . Thyroid nodule    pt reports this was found on him 05/2014    There are no active problems to display for this patient.   Past Surgical History:  Procedure Laterality Date  . CIRCUMCISION    . ESOPHAGOGASTRODUODENOSCOPY          Home Medications    Prior to Admission medications   Medication Sig Start Date End Date Taking? Authorizing Provider  guaiFENesin (MUCINEX) 600 MG 12 hr tablet Take by mouth 2 (two) times daily.    [provider]  ibuprofen (ADVIL,MOTRIN) 600 MG tablet Take 1 tablet (600 mg total) by mouth every 8 (eight) hours as needed. 04/11/17   Azalia Bilisampos, Kevin, MD  omeprazole (PRILOSEC) 20 MG capsule Take 20 mg by mouth daily.    [provider]  predniSONE (STERAPRED UNI-PAK 21 TAB) 10  MG (21) TBPK tablet Take by mouth daily. Take 6 tabs by mouth daily  for 2 days, then 5 tabs for 2 days, then 4 tabs for 2 days, then 3 tabs for 2 days, 2 tabs for 2 days, then 1 tab by mouth daily for 2 days 01/05/18   Dartha LodgeFord, Delshawn Stech N, PA-C  ranitidine (ZANTAC) 150 MG tablet Take 150 mg by mouth 2 (two) times daily.    [provider]  tadalafil (CIALIS) 5 MG tablet Take 5 mg by mouth daily as needed.    [provider]    Family History No family history on file.  Social History Social History   Tobacco Use  . Smoking status: Current Every Day Smoker    Packs/day: 0.50    Types: Cigarettes  . Smokeless tobacco: Never Used  Substance Use Topics  . Alcohol use: Yes    Comment: occasional  . Drug use: Yes    Types: Marijuana     Allergies   Patient has no known allergies.   Review of Systems Review of Systems  Constitutional: Negative for chills and fever.  HENT: Negative for facial swelling.   Respiratory: Negative for shortness of breath.   Gastrointestinal: Negative for nausea and vomiting.  Skin: Positive for rash.     Physical Exam Updated Vital Signs BP 128/66 (BP Location:  Left Arm)   Pulse 64   Temp 98.6 F (37 C) (Oral)   Resp 18   Ht 5\' 8"  (1.727 m)   Wt 88 kg (194 lb)   SpO2 98%   BMI 29.50 kg/m   Physical Exam  Constitutional: He appears well-developed and well-nourished. No distress.  HENT:  Head: Normocephalic and atraumatic.  No facial swelling, posterior oropharynx is clear  Eyes: Right eye exhibits no discharge. Left eye exhibits no discharge.  Neck: Normal range of motion. Neck supple.  Pulmonary/Chest: Effort normal and breath sounds normal. No stridor. No respiratory distress. He has no wheezes. He has no rales.  Neurological: He is alert. Coordination normal.  Skin: Skin is warm and dry. Rash noted. He is not diaphoretic.  Psychiatric: He has a normal mood and affect. His behavior is normal.  Nursing note and vitals  reviewed.    ED Treatments / Results  Labs (all labs ordered are listed, but only abnormal results are displayed) Labs Reviewed - No data to display  EKG None  Radiology No results found.  Procedures Procedures (including critical care time)  Medications Ordered in ED Medications  predniSONE (DELTASONE) tablet 60 mg (60 mg Oral Given 01/05/18 2134)     Initial Impression / Assessment and Plan / ED Course  I have reviewed the triage vital signs and the nursing notes.  Pertinent labs & imaging results that were available during my care of the patient were reviewed by me and considered in my medical decision making (see chart for details).  Rash consistent with poison ivy. Patient denies any difficulty breathing or swallowing.  Pt has a patent airway without stridor and is handling secretions without difficulty; no angioedema. No blisters, no pustules, no warmth, no draining sinus tracts, no superficial abscesses, no bullous impetigo, no vesicles, no desquamation, no target lesions with dusky purpura or a central bulla. Not tender to touch. No concern for superimposed infection. No concern for SJS, TEN, TSS, tick borne illness, syphilis or other life-threatening condition. Will discharge home with course of steroids, recommend Benadryl as needed for pruritis.  Final Clinical Impressions(s) / ED Diagnoses   Final diagnoses:  Poison ivy    ED Discharge Orders        Ordered    predniSONE (STERAPRED UNI-PAK 21 TAB) 10 MG (21) TBPK tablet  Daily     01/05/18 2131       Dartha Lodge, PA-C 01/06/18 0015    Azalia Bilis, MD 01/06/18 [phone]

## 2018-01-05 NOTE — ED Triage Notes (Signed)
C/o week 2 of scattered rash-NAD-steady gait

## 2018-03-18 ENCOUNTER — Encounter (HOSPITAL_BASED_OUTPATIENT_CLINIC_OR_DEPARTMENT_OTHER): Payer: Self-pay | Admitting: *Deleted

## 2018-03-18 ENCOUNTER — Other Ambulatory Visit: Payer: Self-pay

## 2018-03-18 ENCOUNTER — Emergency Department (HOSPITAL_BASED_OUTPATIENT_CLINIC_OR_DEPARTMENT_OTHER)
Admission: EM | Admit: 2018-03-18 | Discharge: 2018-03-18 | Disposition: A | Discharge status: Home / Self Care | Payer: BLUE CROSS/BLUE SHIELD | Attending: Emergency Medicine | Admitting: Emergency Medicine

## 2018-03-18 DIAGNOSIS — F17210 Nicotine dependence, cigarettes, uncomplicated: Secondary | ICD-10-CM | POA: Diagnosis not present

## 2018-03-18 DIAGNOSIS — Y9389 Activity, other specified: Secondary | ICD-10-CM | POA: Diagnosis not present

## 2018-03-18 DIAGNOSIS — M7022 Olecranon bursitis, left elbow: Secondary | ICD-10-CM | POA: Insufficient documentation

## 2018-03-18 DIAGNOSIS — M7021 Olecranon bursitis, right elbow: Secondary | ICD-10-CM | POA: Diagnosis not present

## 2018-03-18 DIAGNOSIS — Z79899 Other long term (current) drug therapy: Secondary | ICD-10-CM | POA: Diagnosis not present

## 2018-03-18 DIAGNOSIS — R2231 Localized swelling, mass and lump, right upper limb: Secondary | ICD-10-CM | POA: Diagnosis present

## 2018-03-18 NOTE — ED Provider Notes (Signed)
[facility] EMERGENCY DEPARTMENT Provider Note   CSN: [id number] Arrival date & time: 03/18/18  0940     History   Chief Complaint Chief Complaint  Patient presents with  . Joint Swelling    HPI Timothy Franklin is a 52 y.o. male who is previously healthy who presents with a one-week history of right elbow swelling.  He denies any pain, except occasional burning.  He denies any acute trauma.  He reports he has noticed a little bit of swelling in the left elbow as well.  He reports he works for [employer] and does a lot of repetitive motion with his arms and states he bumps it a lot.  He also lifts weights.  He also has noticed he rests his elbow on tables a lot at home and is working on bills.  He reports he saw his PCP this past week and was told to take ibuprofen for it.  He just wanted to get a second opinion.  Patient denies any fevers.  HPI  Past Medical History:  Diagnosis Date  . Acid reflux disease   . Thyroid nodule    pt reports this was found on him 05/2014    There are no active problems to display for this patient.   Past Surgical History:  Procedure Laterality Date  . CIRCUMCISION    . ESOPHAGOGASTRODUODENOSCOPY          Home Medications    Prior to Admission medications   Medication Sig Start Date End Date Taking? Authorizing Provider  guaiFENesin (MUCINEX) 600 MG 12 hr tablet Take by mouth 2 (two) times daily.    [provider]  ibuprofen (ADVIL,MOTRIN) 600 MG tablet Take 1 tablet (600 mg total) by mouth every 8 (eight) hours as needed. 04/11/17   Azalia Bilisampos, Kevin, MD  omeprazole (PRILOSEC) 20 MG capsule Take 20 mg by mouth daily.    [provider]  predniSONE (STERAPRED UNI-PAK 21 TAB) 10 MG (21) TBPK tablet Take by mouth daily. Take 6 tabs by mouth daily  for 2 days, then 5 tabs for 2 days, then 4 tabs for 2 days, then 3 tabs for 2 days, 2 tabs for 2 days, then 1 tab by mouth daily for 2 days 01/05/18   Dartha LodgeFord, Kelsey N, PA-C    ranitidine (ZANTAC) 150 MG tablet Take 150 mg by mouth 2 (two) times daily.    [provider]  tadalafil (CIALIS) 5 MG tablet Take 5 mg by mouth daily as needed.    [provider]    Family History History reviewed. No pertinent family history.  Social History Social History   Tobacco Use  . Smoking status: Current Every Day Smoker    Packs/day: 0.50    Types: Cigarettes  . Smokeless tobacco: Never Used  Substance Use Topics  . Alcohol use: Yes    Comment: occasional  . Drug use: Yes    Types: Marijuana     Allergies   Patient has no known allergies.   Review of Systems Review of Systems  Constitutional: Negative for fever.  Musculoskeletal: Positive for joint swelling.     Physical Exam Updated Vital Signs BP 120/64   Pulse (!) 55   Temp 98 F (36.7 C) (Oral)   Resp 18   Ht 5\' 8"  (1.727 m)   Wt 89.8 kg   SpO2 97%   BMI 30.11 kg/m   Physical Exam  Constitutional: He appears well-developed and well-nourished. No distress.  HENT:  Head:  Normocephalic and atraumatic.  Mouth/Throat: Oropharynx is clear and moist. No oropharyngeal exudate.  Eyes: Pupils are equal, round, and reactive to light. Conjunctivae are normal. Right eye exhibits no discharge. Left eye exhibits no discharge. No scleral icterus.  Neck: Normal range of motion. Neck supple. No thyromegaly present.  Cardiovascular: Normal rate, regular rhythm, normal heart sounds and intact distal pulses. Exam reveals no gallop and no friction rub.  No murmur heard. Pulmonary/Chest: Effort normal and breath sounds normal. No stridor. No respiratory distress. He has no wheezes. He has no rales.  Musculoskeletal: He exhibits no edema.       Right elbow: He exhibits swelling (over olecranon, no tenderness). He exhibits normal range of motion.  Patient with swelling of olecranon processes (R>L), no tenderness, redness, warmth; full range of motion of the joint without pain  Lymphadenopathy:     He has no cervical adenopathy.  Neurological: He is alert. Coordination normal.  Skin: Skin is warm and dry. No rash noted. He is not diaphoretic. No pallor.  Psychiatric: He has a normal mood and affect.  Nursing note and vitals reviewed.    ED Treatments / Results  Labs (all labs ordered are listed, but only abnormal results are displayed) Labs Reviewed - No data to display  EKG None  Radiology No results found.  Procedures Procedures (including critical care time)  Medications Ordered in ED Medications - No data to display   Initial Impression / Assessment and Plan / ED Course  I have reviewed the triage vital signs and the nursing notes.  Pertinent labs & imaging results that were available during my care of the patient were reviewed by me and considered in my medical decision making (see chart for details).     Patient with olecranon bursitis, most likely related to patient's occupation.  Supportive treatment discussed including elbow protection, NSAIDs, and ice.  Will refer to sports medicine as needed.  No signs of septic bursitis or septic joint at this time.  Patient is exhibiting no pain or tenderness.  No indication for imaging at this time as he has had no trauma.  Return precautions discussed.  Patient understands and agrees with plan.  Patient vitals stable throughout ED course and discharged in satisfactory condition.  Final Clinical Impressions(s) / ED Diagnoses   Final diagnoses:  Olecranon bursitis of both elbows    ED Discharge Orders    None       Emi Holes, PA-C 03/18/18 1021    Linwood Dibbles, MD 03/19/18 0830

## 2018-03-18 NOTE — Discharge Instructions (Signed)
Take ibuprofen as prescribed over-the-counter, as needed for swelling.  Wear an elbow brace or sleeve to help protect your elbow, especially when you are working.  You can use ice 3-4 times daily alternating 20 minutes on, 20 minutes off.  Please follow-up with Dr. Pearletha ForgeHudnall for further management of your symptoms.  If you develop any severe pain, redness, fever, please return to emergency department.

## 2018-03-18 NOTE — ED Triage Notes (Signed)
Pt c/o right elbow swelling x 1 week

## 2019-03-22 ENCOUNTER — Emergency Department (HOSPITAL_BASED_OUTPATIENT_CLINIC_OR_DEPARTMENT_OTHER): Payer: BC Managed Care – PPO

## 2019-03-22 ENCOUNTER — Encounter (HOSPITAL_BASED_OUTPATIENT_CLINIC_OR_DEPARTMENT_OTHER): Payer: Self-pay | Admitting: Adult Health

## 2019-03-22 ENCOUNTER — Emergency Department (HOSPITAL_BASED_OUTPATIENT_CLINIC_OR_DEPARTMENT_OTHER)
Admission: EM | Admit: 2019-03-22 | Discharge: 2019-03-22 | Disposition: A | Discharge status: Home / Self Care | Payer: BC Managed Care – PPO | Attending: Emergency Medicine | Admitting: Emergency Medicine

## 2019-03-22 ENCOUNTER — Other Ambulatory Visit: Payer: Self-pay

## 2019-03-22 DIAGNOSIS — Z79899 Other long term (current) drug therapy: Secondary | ICD-10-CM | POA: Insufficient documentation

## 2019-03-22 DIAGNOSIS — Y929 Unspecified place or not applicable: Secondary | ICD-10-CM | POA: Diagnosis not present

## 2019-03-22 DIAGNOSIS — Y999 Unspecified external cause status: Secondary | ICD-10-CM | POA: Insufficient documentation

## 2019-03-22 DIAGNOSIS — S098XXA Other specified injuries of head, initial encounter: Secondary | ICD-10-CM | POA: Insufficient documentation

## 2019-03-22 DIAGNOSIS — Y939 Activity, unspecified: Secondary | ICD-10-CM | POA: Insufficient documentation

## 2019-03-22 DIAGNOSIS — F17210 Nicotine dependence, cigarettes, uncomplicated: Secondary | ICD-10-CM | POA: Diagnosis not present

## 2019-03-22 DIAGNOSIS — E041 Nontoxic single thyroid nodule: Secondary | ICD-10-CM | POA: Insufficient documentation

## 2019-03-22 DIAGNOSIS — W2203XA Walked into furniture, initial encounter: Secondary | ICD-10-CM | POA: Diagnosis not present

## 2019-03-22 DIAGNOSIS — S0990XA Unspecified injury of head, initial encounter: Secondary | ICD-10-CM

## 2019-03-22 NOTE — Discharge Instructions (Addendum)
CT of the head without evidence of any skull fracture or any brain injury.  Show some mild soft tissue swelling to the scalp.  Return for any new or worse symptoms.  Can return to work.

## 2019-03-22 NOTE — ED Provider Notes (Addendum)
[facility] EMERGENCY DEPARTMENT Provider Note   CSN: [id number] Arrival date & time: 03/22/19  2055     History   Chief Complaint Chief Complaint  Patient presents with  . Head Injury    HPI Timothy Franklin is a 53 y.o. male.     Patient on Tuesday hit his head hard on the bedpost.  Has a lump to the left temporal area.  No loss of consciousness.  Had some neck soreness for a couple days but that has resolved.  Denies any visual changes any nausea or vomiting.  Does have some head pain and swelling where he hit.     Past Medical History:  Diagnosis Date  . Acid reflux disease   . Thyroid nodule    pt reports this was found on him 05/2014    There are no active problems to display for this patient.   Past Surgical History:  Procedure Laterality Date  . CIRCUMCISION    . ESOPHAGOGASTRODUODENOSCOPY          Home Medications    Prior to Admission medications   Medication Sig Start Date End Date Taking? Authorizing Provider  guaiFENesin (MUCINEX) 600 MG 12 hr tablet Take by mouth 2 (two) times daily.    [provider]  ibuprofen (ADVIL,MOTRIN) 600 MG tablet Take 1 tablet (600 mg total) by mouth every 8 (eight) hours as needed. 04/11/17   Azalia Bilisampos, Kevin, MD  omeprazole (PRILOSEC) 20 MG capsule Take 20 mg by mouth daily.    [provider]  predniSONE (STERAPRED UNI-PAK 21 TAB) 10 MG (21) TBPK tablet Take by mouth daily. Take 6 tabs by mouth daily  for 2 days, then 5 tabs for 2 days, then 4 tabs for 2 days, then 3 tabs for 2 days, 2 tabs for 2 days, then 1 tab by mouth daily for 2 days 01/05/18   Dartha LodgeFord, Kelsey N, PA-C  ranitidine (ZANTAC) 150 MG tablet Take 150 mg by mouth 2 (two) times daily.    [provider]  tadalafil (CIALIS) 5 MG tablet Take 5 mg by mouth daily as needed.    [provider]    Family History History reviewed. No pertinent family history.  Social History Social History   Tobacco Use  . Smoking  status: Current Every Day Smoker    Packs/day: 0.50    Types: Cigarettes  . Smokeless tobacco: Never Used  Substance Use Topics  . Alcohol use: Yes    Comment: occasional  . Drug use: Yes    Types: Marijuana     Allergies   Patient has no known allergies.   Review of Systems Review of Systems  Constitutional: Negative for chills and fever.  HENT: Negative for congestion, rhinorrhea and sore throat.   Eyes: Negative for photophobia and visual disturbance.  Respiratory: Negative for cough and shortness of breath.   Cardiovascular: Negative for chest pain and leg swelling.  Gastrointestinal: Negative for abdominal pain, diarrhea, nausea and vomiting.  Genitourinary: Negative for dysuria.  Musculoskeletal: Negative for back pain and neck pain.  Skin: Negative for rash.  Neurological: Positive for headaches. Negative for dizziness and light-headedness.  Hematological: Does not bruise/bleed easily.  Psychiatric/Behavioral: Negative for confusion.     Physical Exam Updated Vital Signs BP (!) 139/102   Pulse 79   Temp 98.8 F (37.1 C) (Oral)   Resp 18   Ht 1.626 m (5\' 4" )   Wt 89 kg   SpO2 99%   BMI 33.68  kg/m   Physical Exam Vitals signs and nursing note reviewed.  Constitutional:      Appearance: Normal appearance. He is well-developed.  HENT:     Head: Normocephalic.     Comments: Left temporal area with a area of swelling measuring about 3 x 3 cm. Eyes:     Extraocular Movements: Extraocular movements intact.     Conjunctiva/sclera: Conjunctivae normal.     Pupils: Pupils are equal, round, and reactive to light.  Neck:     Musculoskeletal: Neck supple.  Cardiovascular:     Rate and Rhythm: Normal rate and regular rhythm.     Heart sounds: No murmur.  Pulmonary:     Effort: Pulmonary effort is normal. No respiratory distress.     Breath sounds: Normal breath sounds.  Abdominal:     General: Bowel sounds are normal.     Palpations: Abdomen is soft.      Tenderness: There is no abdominal tenderness.  Musculoskeletal: Normal range of motion.  Skin:    General: Skin is warm and dry.  Neurological:     General: No focal deficit present.     Mental Status: He is alert and oriented to person, place, and time.     Cranial Nerves: No cranial nerve deficit.     Sensory: No sensory deficit.     Motor: No weakness.      ED Treatments / Results  Labs (all labs ordered are listed, but only abnormal results are displayed) Labs Reviewed - No data to display  EKG None  Radiology Ct Head Wo Contrast  Result Date: 03/22/2019 CLINICAL DATA:  Struck head on bed post EXAM: CT HEAD WITHOUT CONTRAST TECHNIQUE: Contiguous axial images were obtained from the base of the skull through the vertex without intravenous contrast. COMPARISON:  None. FINDINGS: Brain: No evidence of acute infarction, hemorrhage, hydrocephalus, extra-axial collection or mass lesion/mass effect. Vascular: Atherosclerotic calcification of the carotid siphons. No unexpected hyperdense vessel. Skull: Mild left frontotemporal scalp infiltration. No hematoma. No subjacent calvarial fracture. Sinuses/Orbits: Paranasal sinuses and mastoid air cells are predominantly clear. Included orbital structures are unremarkable. Other: None. IMPRESSION: No acute intracranial abnormality. Mild left frontotemporal scalp infiltration. No hematoma or calvarial fracture. Electronically Signed   By: Lovena Le M.D.   On: 03/22/2019 22:13    Procedures Procedures (including critical care time)  Medications Ordered in ED Medications - No data to display   Initial Impression / Assessment and Plan / ED Course  I have reviewed the triage vital signs and the nursing notes.  Pertinent labs & imaging results that were available during my care of the patient were reviewed by me and considered in my medical decision making (see chart for details).        CT head ordered to rule out any significant or  cranial injury from the head injury on Tuesday.   CT scan of the head had no acute findings.  Patient stable for discharge home.   Final Clinical Impressions(s) / ED Diagnoses   Final diagnoses:  Injury of head, initial encounter    ED Discharge Orders    None       Fredia Sorrow, MD 03/22/19 2159    Fredia Sorrow, MD 03/22/19 2300

## 2019-03-22 NOTE — ED Triage Notes (Signed)
PResents iwth injury to the left side of his head that occurred on Tuesday. He denies LOC, nausea, vomiting and dizziness. His wife was worried and wanted him to be seen. 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

## 2019-05-01 ENCOUNTER — Other Ambulatory Visit: Payer: Self-pay

## 2019-05-01 ENCOUNTER — Emergency Department (HOSPITAL_BASED_OUTPATIENT_CLINIC_OR_DEPARTMENT_OTHER)
Admission: EM | Admit: 2019-05-01 | Discharge: 2019-05-01 | Disposition: A | Discharge status: Home / Self Care | Payer: BC Managed Care – PPO | Attending: Emergency Medicine | Admitting: Emergency Medicine

## 2019-05-01 ENCOUNTER — Encounter (HOSPITAL_BASED_OUTPATIENT_CLINIC_OR_DEPARTMENT_OTHER): Payer: Self-pay | Admitting: Emergency Medicine

## 2019-05-01 DIAGNOSIS — Z79899 Other long term (current) drug therapy: Secondary | ICD-10-CM | POA: Insufficient documentation

## 2019-05-01 DIAGNOSIS — F17210 Nicotine dependence, cigarettes, uncomplicated: Secondary | ICD-10-CM | POA: Diagnosis not present

## 2019-05-01 DIAGNOSIS — J302 Other seasonal allergic rhinitis: Secondary | ICD-10-CM | POA: Diagnosis not present

## 2019-05-01 DIAGNOSIS — R05 Cough: Secondary | ICD-10-CM | POA: Diagnosis present

## 2019-05-01 NOTE — ED Triage Notes (Signed)
Pt having chest congestion and cough x 3 days.  No known fever.  Had recent cold symptoms about 1 month ago.

## 2019-05-01 NOTE — ED Provider Notes (Signed)
[facility] EMERGENCY DEPARTMENT Provider Note   CSN: [id number] Arrival date & time: 05/01/19  3762     History   Chief Complaint Chief Complaint  Patient presents with   Cough    HPI Timothy Franklin is a 53 y.o. male.     Patient is a 53 year old male with a history of tobacco abuse, marijuana use, acid reflux who is presenting today with ongoing cough and congestion.  Patient states approximately 2 weeks ago he had a cold with nasal congestion, cough, runny nose and generalized malaise.  That improved and then his wife got the same thing.  However over the last week he has had morning time cough and mild wheezing.  Throughout the day he will occasionally have runny nose and a tickle at the back of his throat but denies excessive coughing during the day.  He has had no shortness of breath and denies any fever at any time.  He has no chest pain, abdominal pain or swelling.  The history is provided by the patient.    Past Medical History:  Diagnosis Date   Acid reflux disease    Thyroid nodule    pt reports this was found on him 05/2014    There are no active problems to display for this patient.   Past Surgical History:  Procedure Laterality Date   CIRCUMCISION     ESOPHAGOGASTRODUODENOSCOPY          Home Medications    Prior to Admission medications   Medication Sig Start Date End Date Taking? Authorizing Provider  guaiFENesin (MUCINEX) 600 MG 12 hr tablet Take by mouth 2 (two) times daily.    [provider]  ibuprofen (ADVIL,MOTRIN) 600 MG tablet Take 1 tablet (600 mg total) by mouth every 8 (eight) hours as needed. 04/11/17   Azalia Bilis, MD  omeprazole (PRILOSEC) 20 MG capsule Take 20 mg by mouth daily.    [provider]  predniSONE (STERAPRED UNI-PAK 21 TAB) 10 MG (21) TBPK tablet Take by mouth daily. Take 6 tabs by mouth daily  for 2 days, then 5 tabs for 2 days, then 4 tabs for 2 days, then 3 tabs for 2 days, 2 tabs for 2  days, then 1 tab by mouth daily for 2 days 01/05/18   Dartha Lodge, PA-C  ranitidine (ZANTAC) 150 MG tablet Take 150 mg by mouth 2 (two) times daily.    [provider]  tadalafil (CIALIS) 5 MG tablet Take 5 mg by mouth daily as needed.    [provider]    Family History No family history on file.  Social History Social History   Tobacco Use   Smoking status: Current Every Day Smoker    Packs/day: 0.50    Types: Cigarettes   Smokeless tobacco: Never Used  Substance Use Topics   Alcohol use: Yes    Comment: occasional   Drug use: Yes    Types: Marijuana     Allergies   No known allergies   Review of Systems Review of Systems  All other systems reviewed and are negative.    Physical Exam Updated Vital Signs BP 126/87 (BP Location: Right Arm)    Pulse 64    Temp 99 F (37.2 C) (Oral)    Resp 16    SpO2 99%   Physical Exam Vitals signs and nursing note reviewed.  Constitutional:      General: He is not in acute distress.    Appearance: He  is well-developed and normal weight.  HENT:     Head: Normocephalic and atraumatic.     Nose: Mucosal edema and congestion present.     Mouth/Throat:     Mouth: Mucous membranes are moist.     Comments: Mild cobblestoning at the back of the throat with some mucus present Eyes:     Conjunctiva/sclera: Conjunctivae normal.     Pupils: Pupils are equal, round, and reactive to light.  Neck:     Musculoskeletal: Normal range of motion and neck supple.  Cardiovascular:     Rate and Rhythm: Normal rate and regular rhythm.     Pulses: Normal pulses.     Heart sounds: No murmur.  Pulmonary:     Effort: Pulmonary effort is normal. No respiratory distress.     Breath sounds: Normal breath sounds. No wheezing or rales.  Musculoskeletal: Normal range of motion.        General: No tenderness.     Right lower leg: No edema.     Left lower leg: No edema.  Skin:    General: Skin is warm and dry.     Findings:  No erythema or rash.  Neurological:     General: No focal deficit present.     Mental Status: He is alert and oriented to person, place, and time. Mental status is at baseline.  Psychiatric:        Mood and Affect: Mood normal.        Behavior: Behavior normal.        Thought Content: Thought content normal.      ED Treatments / Results  Labs (all labs ordered are listed, but only abnormal results are displayed) Labs Reviewed - No data to display  EKG None  Radiology No results found.  Procedures Procedures (including critical care time)  Medications Ordered in ED Medications - No data to display   Initial Impression / Assessment and Plan / ED Course  I have reviewed the triage vital signs and the nursing notes.  Pertinent labs & imaging results that were available during my care of the patient were reviewed by me and considered in my medical decision making (see chart for details).        Patient presenting today with symptoms more consistent with seasonal allergies.  He wakes up in the morning and has to cough and clear out his congestion but otherwise breathes fine all day long and has minimal cough.  He has been having a tickle in the back of his throat and has evidence of nasal congestion and postnasal drip.  Patient is otherwise well-appearing.  Lung sounds are clear and low suspicion for pneumonia.  He has had no fever and low suspicion for COVID.  Vital signs are reassuring oxygen saturation is 99% on room air and normal blood pressure.  Patient will start Zyrtec or Claritin and saline nasal spray.  He will return if he develops fevers or worsening symptoms.  Final Clinical Impressions(s) / ED Diagnoses   Final diagnoses:  Seasonal allergies    ED Discharge Orders    None       Blanchie Dessert, MD 05/01/19 1058

## 2019-05-01 NOTE — Discharge Instructions (Signed)
Try Claritin or Zyrtec for allergies over the next 2-3 weeks.  Also you can use saline spray for you nose 2-3 times a day.

## 2019-09-15 ENCOUNTER — Encounter (HOSPITAL_BASED_OUTPATIENT_CLINIC_OR_DEPARTMENT_OTHER): Payer: Self-pay | Admitting: Emergency Medicine

## 2019-09-15 ENCOUNTER — Other Ambulatory Visit: Payer: Self-pay

## 2019-09-15 ENCOUNTER — Emergency Department (HOSPITAL_BASED_OUTPATIENT_CLINIC_OR_DEPARTMENT_OTHER)
Admission: EM | Admit: 2019-09-15 | Discharge: 2019-09-15 | Disposition: A | Discharge status: Home / Self Care | Payer: BC Managed Care – PPO | Attending: Emergency Medicine | Admitting: Emergency Medicine

## 2019-09-15 DIAGNOSIS — F17210 Nicotine dependence, cigarettes, uncomplicated: Secondary | ICD-10-CM | POA: Insufficient documentation

## 2019-09-15 DIAGNOSIS — R0981 Nasal congestion: Secondary | ICD-10-CM

## 2019-09-15 DIAGNOSIS — J302 Other seasonal allergic rhinitis: Secondary | ICD-10-CM | POA: Insufficient documentation

## 2019-09-15 DIAGNOSIS — Z79899 Other long term (current) drug therapy: Secondary | ICD-10-CM | POA: Insufficient documentation

## 2019-09-15 MED ORDER — CETIRIZINE HCL 10 MG PO TABS: 10.0000 mg | ORAL_TABLET | Freq: Every day | ORAL | 0 refills | Status: DC

## 2019-09-15 MED ORDER — FLUTICASONE PROPIONATE 50 MCG/ACT NA SUSP: 1.0000 | Freq: Every day | NASAL | 0 refills | Status: DC

## 2019-09-15 NOTE — ED Provider Notes (Signed)
[facility] EMERGENCY DEPARTMENT Provider Note   CSN: [id number] Arrival date & time: 09/15/19  1115     History Chief Complaint  Patient presents with  . Nasal Congestion    Timothy Franklin is a 54 y.o. male with PMHx GERD who presents to the ED today complaining of gradual onset, constant, nasal congestion x 3 weeks.  Also complains of postnasal drip and a tickle in the back of his throat that makes him cough occasionally.  He has been taking Mucinex with mild relief however states he ran out and did not have anything else to take.  States his symptoms are worse at nighttime when he is lying flat.  Denies fevers, chills, shortness of breath, chest pain, sore throat, ear pain, sinus pressure, any other additional symptoms.  No recent COVID-19 positive exposure.  Per chart review patient was seen for similar complaints October 2020.  Advised to take Zyrtec and Flonase however never did this. States these same symptoms occur whenever the weather changes.   The history is provided by the patient and medical records.       Past Medical History:  Diagnosis Date  . Acid reflux disease   . Thyroid nodule    pt reports this was found on him 05/2014    There are no problems to display for this patient.   Past Surgical History:  Procedure Laterality Date  . CIRCUMCISION    . ESOPHAGOGASTRODUODENOSCOPY         No family history on file.  Social History   Tobacco Use  . Smoking status: Current Every Day Smoker    Packs/day: 0.50    Types: Cigarettes  . Smokeless tobacco: Never Used  Substance Use Topics  . Alcohol use: Yes    Comment: occasional  . Drug use: Yes    Types: Marijuana    Home Medications Prior to Admission medications   Medication Sig Start Date End Date Taking? Authorizing Provider  cetirizine (ZYRTEC ALLERGY) 10 MG tablet Take 1 tablet (10 mg total) by mouth daily. 09/15/19 10/15/19  Hyman Hopes, Cephus Tupy, PA-C  fluticasone (FLONASE) 50 MCG/ACT  nasal spray Place 1 spray into both nostrils daily. 09/15/19 10/15/19  Tanda Rockers, PA-C  guaiFENesin (MUCINEX) 600 MG 12 hr tablet Take by mouth 2 (two) times daily.    [provider]  ibuprofen (ADVIL,MOTRIN) 600 MG tablet Take 1 tablet (600 mg total) by mouth every 8 (eight) hours as needed. 04/11/17   Azalia Bilis, MD  omeprazole (PRILOSEC) 20 MG capsule Take 20 mg by mouth daily.    [provider]  predniSONE (STERAPRED UNI-PAK 21 TAB) 10 MG (21) TBPK tablet Take by mouth daily. Take 6 tabs by mouth daily  for 2 days, then 5 tabs for 2 days, then 4 tabs for 2 days, then 3 tabs for 2 days, 2 tabs for 2 days, then 1 tab by mouth daily for 2 days 01/05/18   Dartha Lodge, PA-C  ranitidine (ZANTAC) 150 MG tablet Take 150 mg by mouth 2 (two) times daily.    [provider]  tadalafil (CIALIS) 5 MG tablet Take 5 mg by mouth daily as needed.    [provider]    Allergies    No known allergies  Review of Systems   Review of Systems  Constitutional: Negative for chills and fever.  HENT: Positive for congestion and postnasal drip. Negative for sore throat.     Physical Exam Updated Vital Signs BP 134/67 (BP Location:  Right Arm)   Pulse 64   Temp 98 F (36.7 C) (Oral)   Resp 16   Ht 5\' 8"  (1.727 m)   Wt 89.8 kg   SpO2 100%   BMI 30.11 kg/m   Physical Exam Vitals and nursing note reviewed.  Constitutional:      Appearance: He is not ill-appearing.  HENT:     Head: Normocephalic and atraumatic.     Right Ear: Tympanic membrane normal.     Left Ear: Tympanic membrane normal.     Nose: Congestion present.     Mouth/Throat:     Comments: No posterior oropharyngeal erythema or edema. Post nasal drip appreciated.  Eyes:     Conjunctiva/sclera: Conjunctivae normal.  Cardiovascular:     Rate and Rhythm: Normal rate and regular rhythm.     Pulses: Normal pulses.  Pulmonary:     Effort: Pulmonary effort is normal.     Breath sounds: Normal  breath sounds. No wheezing, rhonchi or rales.  Skin:    General: Skin is warm and dry.     Coloration: Skin is not jaundiced.  Neurological:     Mental Status: He is alert.     ED Results / Procedures / Treatments   Labs (all labs ordered are listed, but only abnormal results are displayed) Labs Reviewed - No data to display  EKG None  Radiology No results found.  Procedures Procedures (including critical care time)  Medications Ordered in ED Medications - No data to display  ED Course  I have reviewed the triage vital signs and the nursing notes.  Pertinent labs & imaging results that were available during my care of the patient were reviewed by me and considered in my medical decision making (see chart for details).  54 year old male who presents to the ED with complaint of nasal congestion and was 3 weeks with similar symptoms whenever the weather changes sporadically.  Seen in the ED in October for same and advised to take Zyrtec and Flonase for symptoms however patient never did this.  He states that this feels similar to that time.  Level to the ED patient is afebrile, nontachycardic and nontachypneic.  He appears to be in no acute distress and is nontoxic-appearing.  His lungs are clear to auscultation bilaterally.  He has an occasional cough in the room and states that this only occurs whenever he feels the postnasal drip dripping down his throat.  Does have postnasal drip on exam.  Speak in full sentences without difficulty and satting 100% on room air.  No recent COVID-19 positive exposure.  Symptoms do not sound just above Covid or other infection today.  He has no maxillary or frontal sinus tenderness to palpation to suggest sinusitis.  Will discharge with instructions for antihistamines and nasal spray.  Patient requesting that I prescribe these.  We will do this for him today.  Patient advised to follow-up with his PCP for further evaluation.  Return precautions have been  discussed.  Patient is in agreement with plan is stable for discharge home.  This note was prepared using Dragon voice recognition software and may include unintentional dictation errors due to the inherent limitations of voice recognition software.     MDM Rules/Calculators/A&P                       Final Clinical Impression(s) / ED Diagnoses Final diagnoses:  Nasal congestion  Seasonal allergies    Rx / DC Orders ED  Discharge Orders         Ordered    fluticasone (FLONASE) 50 MCG/ACT nasal spray  Daily     09/15/19 1156    cetirizine (ZYRTEC ALLERGY) 10 MG tablet  Daily     09/15/19 1156           Discharge Instructions     Please pick up medications and take as prescribed. I would recommend taking these at nighttime before bed given your symptoms are worse at nighttime.   Follow up with your PCP regarding your ED visit today. If you do not there is a 1800 number on the back of this discharge paperwork that will help you find one that takes your insurance.   Return to the ED for any worsening symptoms.        Eustaquio Maize, PA-C 09/15/19 1202    Dorie Rank, MD 09/15/19 [phone]

## 2019-09-15 NOTE — ED Triage Notes (Signed)
Nasal congestion x 3 weeks.

## 2019-09-15 NOTE — Discharge Instructions (Signed)
Please pick up medications and take as prescribed. I would recommend taking these at nighttime before bed given your symptoms are worse at nighttime.   Follow up with your PCP regarding your ED visit today. If you do not there is a 1800 number on the back of this discharge paperwork that will help you find one that takes your insurance.   Return to the ED for any worsening symptoms.

## 2021-02-07 ENCOUNTER — Encounter (HOSPITAL_BASED_OUTPATIENT_CLINIC_OR_DEPARTMENT_OTHER): Payer: Self-pay

## 2021-02-07 ENCOUNTER — Emergency Department (HOSPITAL_BASED_OUTPATIENT_CLINIC_OR_DEPARTMENT_OTHER)
Admission: EM | Admit: 2021-02-07 | Discharge: 2021-02-07 | Disposition: A | Discharge status: Home / Self Care | Payer: BC Managed Care – PPO | Attending: Emergency Medicine | Admitting: Emergency Medicine

## 2021-02-07 ENCOUNTER — Other Ambulatory Visit: Payer: Self-pay

## 2021-02-07 DIAGNOSIS — G959 Disease of spinal cord, unspecified: Secondary | ICD-10-CM | POA: Diagnosis not present

## 2021-02-07 DIAGNOSIS — F17210 Nicotine dependence, cigarettes, uncomplicated: Secondary | ICD-10-CM | POA: Insufficient documentation

## 2021-02-07 DIAGNOSIS — R531 Weakness: Secondary | ICD-10-CM | POA: Diagnosis present

## 2021-02-07 NOTE — ED Triage Notes (Signed)
Pt states his right arm has been weaker. Noticed it more starting a month ago, but started a year ago. Moving arm freely during triage. 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 Denies pain.

## 2021-02-07 NOTE — ED Provider Notes (Signed)
[facility] EMERGENCY DEPARTMENT Provider Note   CSN: [id number] Arrival date & time: 02/07/21  1145     History Chief Complaint  Patient presents with   Extremity Weakness    Timothy Franklin is a 55 y.o. male.  Timothy Franklin is right-handed.  He has worked for [employer] for over 20 years.  For the past year, he has noted that his right extremity has been weaker than his left.  This was particularly noticeable last week when he was on vacation.  His son has cerebral palsy, and he was doing a lot of lifting his son in and out of a [name].  He is also gone to the gym and noticed the strength deficit.  He denies any pain.  He denies any systemic symptoms.  The history is provided by the patient.  Extremity Weakness This is a chronic problem. Episode onset: 1 year ago. The problem has not changed since onset.Pertinent negatives include no chest pain, no abdominal pain, no headaches and no shortness of breath. Exacerbated by: use of right arm. The symptoms are relieved by rest. He has tried nothing for the symptoms. The treatment provided no relief.      Past Medical History:  Diagnosis Date   Acid reflux disease    Thyroid nodule    pt reports this was found on him 05/2014    There are no problems to display for this patient.   Past Surgical History:  Procedure Laterality Date   CIRCUMCISION     ESOPHAGOGASTRODUODENOSCOPY         History reviewed. No pertinent family history.  Social History   Tobacco Use   Smoking status: Every Day    Packs/day: 0.50    Types: Cigarettes   Smokeless tobacco: Never  Substance Use Topics   Alcohol use: Yes    Comment: occasional   Drug use: Yes    Types: Marijuana    Home Medications Prior to Admission medications   Medication Sig Start Date End Date Taking? Authorizing Provider  cetirizine (ZYRTEC ALLERGY) 10 MG tablet Take 1 tablet (10 mg total) by mouth daily. 09/15/19 10/15/19  Hyman Hopes, Margaux, PA-C  fluticasone (FLONASE)  50 MCG/ACT nasal spray Place 1 spray into both nostrils daily. 09/15/19 10/15/19  Tanda Rockers, PA-C  guaiFENesin (MUCINEX) 600 MG 12 hr tablet Take by mouth 2 (two) times daily.    [provider]  ibuprofen (ADVIL,MOTRIN) 600 MG tablet Take 1 tablet (600 mg total) by mouth every 8 (eight) hours as needed. 04/11/17   Azalia Bilis, MD  omeprazole (PRILOSEC) 20 MG capsule Take 20 mg by mouth daily.    [provider]  predniSONE (STERAPRED UNI-PAK 21 TAB) 10 MG (21) TBPK tablet Take by mouth daily. Take 6 tabs by mouth daily  for 2 days, then 5 tabs for 2 days, then 4 tabs for 2 days, then 3 tabs for 2 days, 2 tabs for 2 days, then 1 tab by mouth daily for 2 days 01/05/18   Dartha Lodge, PA-C  ranitidine (ZANTAC) 150 MG tablet Take 150 mg by mouth 2 (two) times daily.    [provider]  tadalafil (CIALIS) 5 MG tablet Take 5 mg by mouth daily as needed.    [provider]    Allergies    No known allergies  Review of Systems   Review of Systems  Constitutional:  Negative for chills and fever.  HENT:  Negative for ear pain and sore throat.  Eyes:  Negative for pain and visual disturbance.  Respiratory:  Negative for cough and shortness of breath.   Cardiovascular:  Negative for chest pain and palpitations.  Gastrointestinal:  Negative for abdominal pain and vomiting.  Genitourinary:  Negative for dysuria and hematuria.  Musculoskeletal:  Positive for extremity weakness. Negative for arthralgias and back pain.  Skin:  Negative for color change and rash.  Neurological:  Positive for weakness. Negative for seizures, syncope and headaches.  All other systems reviewed and are negative.  Physical Exam Updated Vital Signs BP 122/74 (BP Location: Left Arm)   Pulse (!) 59   Temp 97.8 F (36.6 C) (Oral)   Resp 19   Ht 5\' 8"  (1.727 m)   Wt 89.4 kg   SpO2 100%   BMI 29.95 kg/m   Physical Exam Vitals and nursing note reviewed.  Constitutional:       Appearance: Normal appearance.  HENT:     Head: Normocephalic and atraumatic.  Eyes:     Conjunctiva/sclera: Conjunctivae normal.  Pulmonary:     Effort: Pulmonary effort is normal. No respiratory distress.  Musculoskeletal:        General: No deformity. Normal range of motion.     Cervical back: Normal range of motion.     Comments: Extremity warm and well-perfused.   Skin:    General: Skin is warm and dry.  Neurological:     Mental Status: He is alert and oriented to person, place, and time.     Comments: There is mild asymmetry in the musculature of the right upper extremity as compared to the left.  Specifically, the biceps are slightly reduced in bulk when compared to the contralateral side.  He has mild weakness of wrist extension, arm flexion, and shoulder abduction.  Sensation normal.  Psychiatric:        Mood and Affect: Mood normal.    ED Results / Procedures / Treatments   Labs (all labs ordered are listed, but only abnormal results are displayed) Labs Reviewed - No data to display  EKG None  Radiology No results found.  Procedures Procedures   Medications Ordered in ED Medications - No data to display  ED Course  I have reviewed the triage vital signs and the nursing notes.  Pertinent labs & imaging results that were available during my care of the patient were reviewed by me and considered in my medical decision making (see chart for details).    MDM Rules/Calculators/A&P                           Atari Novick presents with 1 year of right upper extremity weakness.  He has no pain with this including no neck pain.  He does have evidence of weakness of his right upper extremity.  At this point, I think he would benefit from consultation with neurology.  The patient states that he really does not want anything done about this, and he can live the rest of his life like this.  However, EMG might be the next appropriate step.  I would like him to discuss this  with neurology. Final Clinical Impression(s) / ED Diagnoses Final diagnoses:  Myelopathy [facility])    Rx / DC Orders ED Discharge Orders     None        [facility], MD 02/07/21 1408

## 2021-03-16 ENCOUNTER — Other Ambulatory Visit: Payer: Self-pay | Admitting: Neurology

## 2021-03-16 DIAGNOSIS — G959 Disease of spinal cord, unspecified: Secondary | ICD-10-CM

## 2021-03-16 NOTE — Progress Notes (Signed)
Patient referred to outpatient neurology. 

## 2021-04-16 ENCOUNTER — Other Ambulatory Visit: Payer: Self-pay

## 2021-04-16 ENCOUNTER — Encounter (HOSPITAL_BASED_OUTPATIENT_CLINIC_OR_DEPARTMENT_OTHER): Payer: Self-pay | Admitting: *Deleted

## 2021-04-16 ENCOUNTER — Emergency Department (HOSPITAL_BASED_OUTPATIENT_CLINIC_OR_DEPARTMENT_OTHER)
Admission: EM | Admit: 2021-04-16 | Discharge: 2021-04-16 | Disposition: A | Discharge status: Home / Self Care | Payer: BC Managed Care – PPO | Attending: Emergency Medicine | Admitting: Emergency Medicine

## 2021-04-16 DIAGNOSIS — R21 Rash and other nonspecific skin eruption: Secondary | ICD-10-CM | POA: Insufficient documentation

## 2021-04-16 DIAGNOSIS — F17210 Nicotine dependence, cigarettes, uncomplicated: Secondary | ICD-10-CM | POA: Diagnosis not present

## 2021-04-16 NOTE — ED Triage Notes (Signed)
Black spot on his chest. States it started after lifting weights. He is not sure if it is a bruise or a rash. No itching or pain.

## 2021-04-16 NOTE — Discharge Instructions (Addendum)
Follow-up with a dermatologist as discussed.  Return here as needed for any worsening symptoms.

## 2021-04-16 NOTE — ED Provider Notes (Signed)
[facility] EMERGENCY DEPARTMENT Provider Note   CSN: [id number] Arrival date & time: 04/16/21  1718     History No chief complaint on file.   Timothy Franklin is a 55 y.o. male.  Patient is a 55 year old male who presents with a discolored area to his chest.  He says its been there maybe 6 months or more.  He first noticed it about 6 months ago.  He thinks that maybe it was a bruise because he lifts weights and he brings the weight down onto his chest and many times he wears is about sweater and the zipper gets pushed into his chest in that area.  He denies any pain to the area.  No itching.  He says he has been using cocoa butter on it and it seems to have gotten a little bit smaller.      Past Medical History:  Diagnosis Date   Acid reflux disease    Thyroid nodule    pt reports this was found on him 05/2014    There are no problems to display for this patient.   Past Surgical History:  Procedure Laterality Date   CIRCUMCISION     ESOPHAGOGASTRODUODENOSCOPY         No family history on file.  Social History   Tobacco Use   Smoking status: Every Day    Packs/day: 0.50    Types: Cigarettes   Smokeless tobacco: Never  Vaping Use   Vaping Use: Never used  Substance Use Topics   Alcohol use: Yes    Comment: occasional   Drug use: Yes    Types: Marijuana    Home Medications Prior to Admission medications   Medication Sig Start Date End Date Taking? Authorizing Provider  cetirizine (ZYRTEC ALLERGY) 10 MG tablet Take 1 tablet (10 mg total) by mouth daily. 09/15/19 10/15/19  Hyman Hopes, Margaux, PA-C  fluticasone (FLONASE) 50 MCG/ACT nasal spray Place 1 spray into both nostrils daily. 09/15/19 10/15/19  Tanda Rockers, PA-C  guaiFENesin (MUCINEX) 600 MG 12 hr tablet Take by mouth 2 (two) times daily.    [provider]  ibuprofen (ADVIL,MOTRIN) 600 MG tablet Take 1 tablet (600 mg total) by mouth every 8 (eight) hours as needed. 04/11/17   Azalia Bilis, MD  omeprazole (PRILOSEC) 20 MG capsule Take 20 mg by mouth daily.    [provider]  predniSONE (STERAPRED UNI-PAK 21 TAB) 10 MG (21) TBPK tablet Take by mouth daily. Take 6 tabs by mouth daily  for 2 days, then 5 tabs for 2 days, then 4 tabs for 2 days, then 3 tabs for 2 days, 2 tabs for 2 days, then 1 tab by mouth daily for 2 days 01/05/18   Dartha Lodge, PA-C  ranitidine (ZANTAC) 150 MG tablet Take 150 mg by mouth 2 (two) times daily.    [provider]  tadalafil (CIALIS) 5 MG tablet Take 5 mg by mouth daily as needed.    [provider]    Allergies    No known allergies  Review of Systems   Review of Systems  Constitutional:  Negative for fever.  Respiratory:  Negative for shortness of breath.   Cardiovascular:  Negative for chest pain.  Skin:  Positive for rash. Negative for wound.  All other systems reviewed and are negative.  Physical Exam Updated Vital Signs BP (!) 159/93 (BP Location: Right Arm)   Pulse 93   Temp 98.5 F (36.9 C) (Oral)   Resp 18  Ht 5\' 8"  (1.727 m)   Wt 88.5 kg   SpO2 98%   BMI 29.65 kg/m   Physical Exam Constitutional:      Appearance: He is well-developed.  HENT:     Head: Normocephalic and atraumatic.  Cardiovascular:     Rate and Rhythm: Normal rate.  Pulmonary:     Effort: Pulmonary effort is normal.  Musculoskeletal:        General: No tenderness.     Cervical back: Normal range of motion and neck supple.     Comments: Patient has a quarter size dark brown discolored area to the center of his chest over his lower sternum.  There is no warmth or erythema.  No discharge.  No tenderness to the area.  No swelling.  Skin:    General: Skin is warm and dry.  Neurological:     Mental Status: He is alert and oriented to person, place, and time.    ED Results / Procedures / Treatments   Labs (all labs ordered are listed, but only abnormal results are displayed) Labs Reviewed - No data to  display  EKG None  Radiology No results found.  Procedures Procedures   Medications Ordered in ED Medications - No data to display  ED Course  I have reviewed the triage vital signs and the nursing notes.  Pertinent labs & imaging results that were available during my care of the patient were reviewed by me and considered in my medical decision making (see chart for details).    MDM Rules/Calculators/A&P                           Patient presents with a 6 months history of a rash to his chest.  It does not appear to be consistent with a bruise.  It is a skin lesion.  There is no suggestions of infection.  Will refer to dermatology for further evaluation. Final Clinical Impression(s) / ED Diagnoses Final diagnoses:  Rash    Rx / DC Orders ED Discharge Orders     None        , MD 04/16/21 2158

## 2021-04-19 ENCOUNTER — Other Ambulatory Visit: Payer: Self-pay

## 2021-04-19 ENCOUNTER — Emergency Department (HOSPITAL_BASED_OUTPATIENT_CLINIC_OR_DEPARTMENT_OTHER)
Admission: EM | Admit: 2021-04-19 | Discharge: 2021-04-19 | Disposition: A | Discharge status: Home / Self Care | Payer: BC Managed Care – PPO | Attending: Emergency Medicine | Admitting: Emergency Medicine

## 2021-04-19 ENCOUNTER — Encounter (HOSPITAL_BASED_OUTPATIENT_CLINIC_OR_DEPARTMENT_OTHER): Payer: Self-pay | Admitting: Emergency Medicine

## 2021-04-19 DIAGNOSIS — S39012A Strain of muscle, fascia and tendon of lower back, initial encounter: Secondary | ICD-10-CM

## 2021-04-19 DIAGNOSIS — F17210 Nicotine dependence, cigarettes, uncomplicated: Secondary | ICD-10-CM | POA: Insufficient documentation

## 2021-04-19 DIAGNOSIS — M549 Dorsalgia, unspecified: Secondary | ICD-10-CM | POA: Diagnosis not present

## 2021-04-19 MED ORDER — NAPROXEN 375 MG PO TABS: 375.0000 mg | ORAL_TABLET | Freq: Two times a day (BID) | ORAL | 0 refills | Status: DC

## 2021-04-19 MED ORDER — LIDOCAINE 5 % EX PTCH: 1.0000 | MEDICATED_PATCH | CUTANEOUS | 0 refills | Status: DC

## 2021-04-19 MED ORDER — LIDOCAINE 5 % EX PTCH
1.0000 | MEDICATED_PATCH | CUTANEOUS | Status: DC
  Administered 2021-04-19: 1 via TRANSDERMAL
  Filled 2021-04-19: qty 1

## 2021-04-19 MED ORDER — KETOROLAC TROMETHAMINE 30 MG/ML IJ SOLN
30.0000 mg | Freq: Once | INTRAMUSCULAR | Status: AC
  Administered 2021-04-19: 30 mg via INTRAMUSCULAR
  Filled 2021-04-19: qty 1

## 2021-04-19 NOTE — ED Triage Notes (Signed)
Pt reports pain and spasms to LT middle back since Thurs after he twisted the wrong way

## 2021-04-19 NOTE — ED Notes (Signed)
Shot time 2029

## 2021-04-19 NOTE — Discharge Instructions (Signed)
Take the anti-inflammatory with food and water to twice daily.  Take it for 5 days.  He can use Lidoderm patches as needed, apply them to the area of every 24 hours as needed for pain.  Follow-up with orthopedic if there is no improvement in the next week.  Return if things worsen.

## 2021-04-19 NOTE — ED Provider Notes (Signed)
[facility] EMERGENCY DEPARTMENT Provider Note   CSN: [id number] Arrival date & time: 04/19/21  1923     History Chief Complaint  Patient presents with   Back Pain    Timothy Franklin is a 55 y.o. male.   Back Pain Associated symptoms: no chest pain and no dysuria    Patient presents with back pain.  Started about 3 days ago when he turned to his left side quickly, he felt like he pulled a muscle.  He did proceed electrolytes with his friends that day without any issues but after that he started having steady pain on the side that is worsened by ambulation.  It is also worsened by turning. He has tried icy hot which has provided some relief.  Reports that he was seen in the ED yesterday for a rash that sitting in the chair made his back pain worse. No nausea or vomiting, no dysuria, no urinary retention, bilateral leg numbness, bilateral leg weakness, saddle anesthesia, history of IV drug use, history of surgical procedures to his spine, history of malignancy.    Past Medical History:  Diagnosis Date   Acid reflux disease    Thyroid nodule    pt reports this was found on him 05/2014    There are no problems to display for this patient.   Past Surgical History:  Procedure Laterality Date   CIRCUMCISION     ESOPHAGOGASTRODUODENOSCOPY         No family history on file.  Social History   Tobacco Use   Smoking status: Every Day    Packs/day: 0.50    Types: Cigarettes   Smokeless tobacco: Never  Vaping Use   Vaping Use: Never used  Substance Use Topics   Alcohol use: Yes    Comment: occasional   Drug use: Yes    Types: Marijuana    Home Medications Prior to Admission medications   Medication Sig Start Date End Date Taking? Authorizing Provider  cetirizine (ZYRTEC ALLERGY) 10 MG tablet Take 1 tablet (10 mg total) by mouth daily. 09/15/19 10/15/19  Hyman Hopes, Margaux, PA-C  fluticasone (FLONASE) 50 MCG/ACT nasal spray Place 1 spray into both nostrils daily.  09/15/19 10/15/19  Tanda Rockers, PA-C  guaiFENesin (MUCINEX) 600 MG 12 hr tablet Take by mouth 2 (two) times daily.    [provider]  ibuprofen (ADVIL,MOTRIN) 600 MG tablet Take 1 tablet (600 mg total) by mouth every 8 (eight) hours as needed. 04/11/17   Azalia Bilis, MD  omeprazole (PRILOSEC) 20 MG capsule Take 20 mg by mouth daily.    [provider]  predniSONE (STERAPRED UNI-PAK 21 TAB) 10 MG (21) TBPK tablet Take by mouth daily. Take 6 tabs by mouth daily  for 2 days, then 5 tabs for 2 days, then 4 tabs for 2 days, then 3 tabs for 2 days, 2 tabs for 2 days, then 1 tab by mouth daily for 2 days 01/05/18   Dartha Lodge, PA-C  ranitidine (ZANTAC) 150 MG tablet Take 150 mg by mouth 2 (two) times daily.    [provider]  tadalafil (CIALIS) 5 MG tablet Take 5 mg by mouth daily as needed.    [provider]    Allergies    No known allergies  Review of Systems   Review of Systems  Cardiovascular:  Negative for chest pain.  Genitourinary:  Negative for difficulty urinating and dysuria.  Musculoskeletal:  Positive for back pain.   Physical Exam Updated Vital Signs  BP 128/85   Pulse 70   Temp 97.8 F (36.6 C) (Oral)   Resp 18   Ht 5\' 8"  (1.727 m)   Wt 88.5 kg   SpO2 99%   BMI 29.65 kg/m   Physical Exam Vitals and nursing note reviewed. Exam conducted with a chaperone present.  Constitutional:      General: He is not in acute distress.    Appearance: Normal appearance.  HENT:     Head: Normocephalic and atraumatic.  Eyes:     General: No scleral icterus.    Extraocular Movements: Extraocular movements intact.     Pupils: Pupils are equal, round, and reactive to light.  Musculoskeletal:        General: Normal range of motion.     Comments: Tenderness to the middle of the low back, no decreased range of motion to the thoracic or lumbar spine.  Skin:    Coloration: Skin is not jaundiced.  Neurological:     Mental Status: He is alert.  Mental status is at baseline.     Coordination: Coordination normal.     Comments: Cranial nerves III through XII grossly intact, grips and equal bilaterally, able to raise both legs against resistance.     ED Results / Procedures / Treatments   Labs (all labs ordered are listed, but only abnormal results are displayed) Labs Reviewed - No data to display  EKG None  Radiology No results found.  Procedures Procedures   Medications Ordered in ED Medications  ketorolac (TORADOL) 30 MG/ML injection 30 mg (has no administration in time range)  lidocaine (LIDODERM) 5 % 1 patch (has no administration in time range)    ED Course  I have reviewed the triage vital signs and the nursing notes.  Pertinent labs & imaging results that were available during my care of the patient were reviewed by me and considered in my medical decision making (see chart for details).    MDM Rules/Calculators/A&P                           Patient vital stable, not febrile.  Suspect patient likely pulled a muscle while at the gym, no trauma to the back or history of spinal surgeries.  No red flags for cauda equina syndrome or Epidural abscess.  No focal deficits on neuro exam.  He is able to ambulate without issues.  We will treat patient's pain and give him course of anti-inflammatory with orthopedic follow-up as needed.  Stable and appropriate for discharge at this time.  Final Clinical Impression(s) / ED Diagnoses Final diagnoses:  None    Rx / DC Orders ED Discharge Orders     None        , Theron Arista 04/19/21 2009    2010, MD 04/20/21 1654

## 2021-04-22 ENCOUNTER — Ambulatory Visit (INDEPENDENT_AMBULATORY_CARE_PROVIDER_SITE_OTHER): Payer: BC Managed Care – PPO | Admitting: Family Medicine

## 2021-04-22 ENCOUNTER — Encounter: Payer: Self-pay | Admitting: Family Medicine

## 2021-04-22 VITALS — Ht 68.0 in | Wt 195.0 lb

## 2021-04-22 DIAGNOSIS — S39012A Strain of muscle, fascia and tendon of lower back, initial encounter: Secondary | ICD-10-CM | POA: Diagnosis not present

## 2021-04-22 MED ORDER — KETOROLAC TROMETHAMINE 30 MG/ML IJ SOLN
30.0000 mg | Freq: Once | INTRAMUSCULAR | Status: AC
  Administered 2021-04-22: 30 mg via INTRAMUSCULAR

## 2021-04-22 MED ORDER — METHYLPREDNISOLONE ACETATE 40 MG/ML IJ SUSP
40.0000 mg | Freq: Once | INTRAMUSCULAR | Status: AC
  Administered 2021-04-22: 40 mg via INTRAMUSCULAR

## 2021-04-22 NOTE — Patient Instructions (Signed)
Nice to meet you Please try heat  Please try the exercises   Please send me a message in MyChart with any questions or updates.  Please see me back in 2 weeks.   --Dr. Darianne Muralles  

## 2021-04-22 NOTE — Progress Notes (Signed)
  Timothy Franklin - 55 y.o. male MRN [id number]  Date of birth: [birth year]  SUBJECTIVE:  Including CC & ROS.  No chief complaint on file.   Timothy Franklin is a 55 y.o. male that is presenting with acute left back pain.  Does have some pain that radiates to the front of his abdomen.  No radicular pain.  No history of similar pain and no history of surgery.   Review of Systems See HPI   HISTORY: Past Medical, Surgical, Social, and Family History Reviewed & Updated per EMR.   Pertinent Historical Findings include:  Past Medical History:  Diagnosis Date   Acid reflux disease    Thyroid nodule    pt reports this was found on him 05/2014    Past Surgical History:  Procedure Laterality Date   CIRCUMCISION     ESOPHAGOGASTRODUODENOSCOPY      History reviewed. No pertinent family history.  Social History   Socioeconomic History   Marital status: Married    Spouse name: Not on file   Number of children: Not on file   Years of education: Not on file   Highest education level: Not on file  Occupational History   Not on file  Tobacco Use   Smoking status: Every Day    Packs/day: 0.50    Types: Cigarettes   Smokeless tobacco: Never  Vaping Use   Vaping Use: Never used  Substance and Sexual Activity   Alcohol use: Yes    Comment: occasional   Drug use: Yes    Types: Marijuana   Sexual activity: Not on file  Other Topics Concern   Not on file  Social History Narrative   Not on file   Social Determinants of Health   Financial Resource Strain: Not on file  Food Insecurity: Not on file  Transportation Needs: Not on file  Physical Activity: Not on file  Stress: Not on file  Social Connections: Not on file  Intimate Partner Violence: Not on file     PHYSICAL EXAM:  VS: Ht 5\' 8"  (1.727 m)   Wt 195 lb (88.5 kg)   BMI 29.65 kg/m  Physical Exam Gen: NAD, alert, cooperative with exam, well-appearing      ASSESSMENT & PLAN:   Strain of lumbar region Symptoms  seem most consistent with a muscle origin.  No radicular type pain today. -Counseled on home exercise therapy and supportive care. -IM Depo and Toradol. -Provided work note. -Could consider physical therapy or imaging.

## 2021-04-22 NOTE — Assessment & Plan Note (Signed)
Symptoms seem most consistent with a muscle origin.  No radicular type pain today. -Counseled on home exercise therapy and supportive care. -IM Depo and Toradol. -Provided work note. -Could consider physical therapy or imaging.

## 2021-05-04 ENCOUNTER — Other Ambulatory Visit: Payer: Self-pay

## 2021-05-04 ENCOUNTER — Encounter: Payer: Self-pay | Admitting: Diagnostic Neuroimaging

## 2021-05-04 ENCOUNTER — Ambulatory Visit (INDEPENDENT_AMBULATORY_CARE_PROVIDER_SITE_OTHER): Payer: BC Managed Care – PPO | Admitting: Diagnostic Neuroimaging

## 2021-05-04 VITALS — BP 128/83 | HR 82 | Ht 68.0 in | Wt 195.0 lb

## 2021-05-04 DIAGNOSIS — M6281 Muscle weakness (generalized): Secondary | ICD-10-CM | POA: Diagnosis not present

## 2021-05-04 NOTE — Patient Instructions (Signed)
-   check MRI cervical spine  - check EMG/NCS (electrical testing)  - check labs

## 2021-05-04 NOTE — Progress Notes (Signed)
[facility]  PATIENT: Timothy Franklin DOB: [birth year]  REFERRING CLINICIAN: Jefferson Fuel, MD HISTORY FROM: patient  REASON FOR VISIT: new consult    HISTORICAL  CHIEF COMPLAINT:  Chief Complaint  Patient presents with   Myelopathy    Rm 7 New Pt, ED referral "my back is better now, taking Ibuprofen 800 mg as needed but not often, Naproxen didn't help"    HISTORY OF PRESENT ILLNESS:   55 year old male here for evaluation of gradual onset progressive right arm weakness and atrophy.  Symptoms started about 1 year ago.  He noticed weakness in his right shoulder, biceps region.  No prodromal accidents injuries or traumas.  Symptoms gradually started have progressed.  No significant numbness, tingling or pain.  He has some chronic neck pain issues.  No problems with left arm or lower extremities.  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  He also works out regularly at [employer] with weightlifting exercises.  As result of this weakness he is having more trouble with his exercises and work.  Went to the emergency room in July 2022 for evaluation and they recommended neurology follow-up.   REVIEW OF SYSTEMS: Full 14 system review of systems performed and negative with exception of: as per HPI.  ALLERGIES: Allergies  Allergen Reactions   No Known Allergies     HOME MEDICATIONS: Outpatient Medications Prior to Visit  Medication Sig Dispense Refill   ibuprofen (ADVIL) 200 MG tablet Take 200 mg by mouth every 6 (six) hours as needed.     tadalafil (CIALIS) 5 MG tablet Take 5 mg by mouth daily as needed.     lidocaine (LIDODERM) 5 % Place 1 patch onto the skin daily. Remove & Discard patch within 12 hours or as directed by MD (Patient not taking: Reported on 05/04/2021) 30 patch 0   naproxen (NAPROSYN) 375 MG tablet Take 1 tablet (375 mg total) by mouth 2 (two) times daily. (Patient not taking: Reported on 05/04/2021) 10 tablet 0    cetirizine (ZYRTEC ALLERGY) 10 MG tablet Take 1 tablet (10 mg total) by mouth daily. 30 tablet 0   fluticasone (FLONASE) 50 MCG/ACT nasal spray Place 1 spray into both nostrils daily. 9.9 mL 0   guaiFENesin (MUCINEX) 600 MG 12 hr tablet Take by mouth 2 (two) times daily.     ibuprofen (ADVIL,MOTRIN) 600 MG tablet Take 1 tablet (600 mg total) by mouth every 8 (eight) hours as needed. 15 tablet 0   omeprazole (PRILOSEC) 20 MG capsule Take 20 mg by mouth daily.     predniSONE (STERAPRED UNI-PAK 21 TAB) 10 MG (21) TBPK tablet Take by mouth daily. Take 6 tabs by mouth daily  for 2 days, then 5 tabs for 2 days, then 4 tabs for 2 days, then 3 tabs for 2 days, 2 tabs for 2 days, then 1 tab by mouth daily for 2 days 42 tablet 0   ranitidine (ZANTAC) 150 MG tablet Take 150 mg by mouth 2 (two) times daily.     No facility-administered medications prior to visit.    PAST MEDICAL HISTORY: Past Medical History:  Diagnosis Date   Acid reflux disease    Thyroid nodule    pt reports this was found on him 05/2014    PAST SURGICAL HISTORY: Past Surgical History:  Procedure Laterality Date   CIRCUMCISION     ESOPHAGOGASTRODUODENOSCOPY      FAMILY HISTORY: Family History  Problem Relation  Age of Onset   Diabetes Mother    Hypertension Mother     SOCIAL HISTORY: Social History   Socioeconomic History   Marital status: Married    Spouse name: Timothy Franklin   Number of children: 3   Years of education: Not on file   Highest education level: High school graduate  Occupational History    Comment: [employer]  Tobacco Use   Smoking status: Every Day    Packs/day: 0.50    Types: Cigarettes   Smokeless tobacco: Never  Vaping Use   Vaping Use: Never used  Substance and Sexual Activity   Alcohol use: Yes    Comment: occasional   Drug use: Yes    Types: Marijuana    Comment: 05/04/21 2-3 x week   Sexual activity: Not on file  Other Topics Concern   Not on file  Social History Narrative   Lives with wife    Caffeine-  coffee 1 c daily   Social Determinants of Health   Financial Resource Strain: Not on file  Food Insecurity: Not on file  Transportation Needs: Not on file  Physical Activity: Not on file  Stress: Not on file  Social Connections: Not on file  Intimate Partner Violence: Not on file     PHYSICAL EXAM  GENERAL EXAM/CONSTITUTIONAL: Vitals:  Vitals:   05/04/21 0820  BP: 128/83  Pulse: 82  Weight: 195 lb (88.5 kg)  Height: 5\' 8"  (1.727 m)   Body mass index is 29.65 kg/m. Wt Readings from Last 3 Encounters:  05/04/21 195 lb (88.5 kg)  04/22/21 195 lb (88.5 kg)  04/19/21 195 lb (88.5 kg)   Patient is in no distress; well developed, nourished and groomed; neck is supple  CARDIOVASCULAR: Examination of carotid arteries is normal; no carotid bruits Regular rate and rhythm, no murmurs Examination of peripheral vascular system by observation and palpation is normal  EYES: Ophthalmoscopic exam of optic discs and posterior segments is normal; no papilledema or hemorrhages No results found.  MUSCULOSKELETAL: Gait, strength, tone, movements noted in Neurologic exam below  NEUROLOGIC: MENTAL STATUS:  No flowsheet data found. awake, alert, oriented to person, place and time recent and remote memory intact normal attention and concentration language fluent, comprehension intact, naming intact fund of knowledge appropriate  CRANIAL NERVE:  2nd - no papilledema on fundoscopic exam 2nd, 3rd, 4th, 6th - pupils equal and reactive to light, visual fields full to confrontation, extraocular muscles intact, no nystagmus 5th - facial sensation symmetric 7th - facial strength symmetric 8th - hearing intact 9th - palate elevates symmetrically, uvula midline 11th - shoulder shrug symmetric 12th - tongue protrusion midline  MOTOR:  normal bulk and tone, full strength in the BUE, BLE; EXCEPT RIGHT UPPER EXT (ATROPHY IN DELTOID, INFRASPINATUS, BICEPS, BRACHIORAD --> 3/5;  RIGHT TRICEPS ? 4+/5; GRIP AND FINGER EXT / ABD 5/5) NO FASCICULATIONS  SENSORY:  normal and symmetric to light touch, temperature, vibration  COORDINATION:  finger-nose-finger, fine finger movements normal  REFLEXES:  deep tendon reflexes present and symmetric; EXCEPT DECR IN RIGHT BICEPS, BRACHIORAD  GAIT/STATION:  narrow based gait     DIAGNOSTIC DATA (LABS, IMAGING, TESTING) - I reviewed patient records, labs, notes, testing and imaging myself where available.  Lab Results  Component Value Date   WBC 6.4 12/30/2017   HGB 14.8 12/30/2017   HCT 41.2 12/30/2017   MCV 92.0 12/30/2017   PLT 190 12/30/2017      Component Value Date/Time   NA 139 12/30/2017 1942  K 3.8 12/30/2017 1942   CL 105 12/30/2017 1942   CO2 26 12/30/2017 1942   GLUCOSE 90 12/30/2017 1942   BUN 16 12/30/2017 1942   CREATININE 1.19 12/30/2017 1942   CALCIUM 9.0 12/30/2017 1942   GFRNONAA >60 12/30/2017 1942   GFRAA >60 12/30/2017 1942   No results found for: CHOL, HDL, LDLCALC, LDLDIRECT, TRIG, CHOLHDL No results found for: TDDU2G No results found for: VITAMINB12 Lab Results  Component Value Date   TSH 0.524 09/05/2014       ASSESSMENT AND PLAN  55 y.o. year old male here with gradual onset progressive right upper extremity weakness mainly affecting right C5 and C6 innervated muscles since 2021.  We will proceed with further work-up.   Dx:  1. Muscle weakness of right upper extremity       PLAN:  RIGHT UPPER EXTREMITY WEAKNESS / HYPOREFLEXIA / ATROPHY (right C5, C6 radiculopathy vs upper brachial plexopathy vs monomelic amyotrophy) - check MRI cervical spine (rule out autoimmune, infection, compressive) - check EMG/NCS - check labs (myopathy, neuropathy)    Orders Placed This Encounter  Procedures   MR CERVICAL SPINE W WO CONTRAST   CBC with Differential/Platelet   Comprehensive metabolic panel   Hemoglobin A1c   CK   Aldolase   Vitamin B12   TSH   NCV with  EMG(electromyography)    Return for for NCV/EMG.    Suanne Marker, MD 05/04/2021, 9:27 AM Certified in Neurology, Neurophysiology and Neuroimaging  [facility] [address], [address] [place], [place] [address] [phone]

## 2021-05-05 LAB — CBC WITH DIFFERENTIAL/PLATELET
Basophils Absolute: 0 10*3/uL (ref 0.0–0.2)
Basos: 1 %
EOS (ABSOLUTE): 0.2 10*3/uL (ref 0.0–0.4)
Eos: 3 %
Hematocrit: 44.2 % (ref 37.5–51.0)
Hemoglobin: 15.6 g/dL (ref 13.0–17.7)
Immature Grans (Abs): 0 10*3/uL (ref 0.0–0.1)
Immature Granulocytes: 0 %
Lymphocytes Absolute: 2.4 10*3/uL (ref 0.7–3.1)
Lymphs: 33 %
MCH: 32.8 pg (ref 26.6–33.0)
MCHC: 35.3 g/dL (ref 31.5–35.7)
MCV: 93 fL (ref 79–97)
Monocytes Absolute: 0.6 10*3/uL (ref 0.1–0.9)
Monocytes: 8 %
Neutrophils Absolute: 4 10*3/uL (ref 1.4–7.0)
Neutrophils: 55 %
Platelets: 223 10*3/uL (ref 150–450)
RBC: 4.76 x10E6/uL (ref 4.14–5.80)
RDW: 13.8 % (ref 11.6–15.4)
WBC: 7.2 10*3/uL (ref 3.4–10.8)

## 2021-05-05 LAB — COMPREHENSIVE METABOLIC PANEL
ALT: 12 IU/L (ref 0–44)
AST: 16 IU/L (ref 0–40)
Albumin/Globulin Ratio: 2 (ref 1.2–2.2)
Albumin: 4.5 g/dL (ref 3.8–4.9)
Alkaline Phosphatase: 67 IU/L (ref 44–121)
BUN/Creatinine Ratio: 12 (ref 9–20)
BUN: 15 mg/dL (ref 6–24)
Bilirubin Total: 0.4 mg/dL (ref 0.0–1.2)
CO2: 22 mmol/L (ref 20–29)
Calcium: 9.5 mg/dL (ref 8.7–10.2)
Chloride: 106 mmol/L (ref 96–106)
Creatinine, Ser: 1.22 mg/dL (ref 0.76–1.27)
Globulin, Total: 2.3 g/dL (ref 1.5–4.5)
Glucose: 95 mg/dL (ref 70–99)
Potassium: 4.5 mmol/L (ref 3.5–5.2)
Sodium: 142 mmol/L (ref 134–144)
Total Protein: 6.8 g/dL (ref 6.0–8.5)
eGFR: 70 mL/min/{1.73_m2} (ref >59)

## 2021-05-05 LAB — HEMOGLOBIN A1C
Est. average glucose Bld gHb Est-mCnc: 131 mg/dL
Hgb A1c MFr Bld: 6.2 % — ABNORMAL HIGH (ref 4.8–5.6)

## 2021-05-05 LAB — VITAMIN B12: Vitamin B-12: 408 pg/mL (ref 232–1245)

## 2021-05-05 LAB — CK: Total CK: 358 U/L — ABNORMAL HIGH (ref 41–331)

## 2021-05-05 LAB — ALDOLASE: Aldolase: 5.5 U/L (ref 3.3–10.3)

## 2021-05-05 LAB — TSH: TSH: 0.685 u[IU]/mL (ref 0.450–4.500)

## 2021-05-06 ENCOUNTER — Telehealth: Payer: Self-pay | Admitting: *Deleted

## 2021-05-06 NOTE — Telephone Encounter (Signed)
Spoke with patient and informed him his labs show borderline diabetes. CK muscle enzymes are slightly up, but his other labs ok. Dr Marjory Lies recommends  to continue  testing as planned (MRI, EMG). Per records, patient declined scheduling NCS. He stated he will call back today to discus that further. Patient verbalized understanding, appreciation.

## 2021-05-07 ENCOUNTER — Telehealth: Payer: Self-pay | Admitting: Diagnostic Neuroimaging

## 2021-05-07 DIAGNOSIS — M4802 Spinal stenosis, cervical region: Secondary | ICD-10-CM

## 2021-05-07 DIAGNOSIS — M6281 Muscle weakness (generalized): Secondary | ICD-10-CM

## 2021-05-07 NOTE — Telephone Encounter (Signed)
pt has [facility] faxed order to them, they will reach out to the patient to schedule [facility] ph # [phone].

## 2021-05-08 ENCOUNTER — Encounter (HOSPITAL_BASED_OUTPATIENT_CLINIC_OR_DEPARTMENT_OTHER): Payer: Self-pay | Admitting: *Deleted

## 2021-05-08 ENCOUNTER — Emergency Department (HOSPITAL_BASED_OUTPATIENT_CLINIC_OR_DEPARTMENT_OTHER)
Admission: EM | Admit: 2021-05-08 | Discharge: 2021-05-08 | Disposition: A | Discharge status: Home / Self Care | Payer: BC Managed Care – PPO | Attending: Emergency Medicine | Admitting: Emergency Medicine

## 2021-05-08 ENCOUNTER — Other Ambulatory Visit: Payer: Self-pay

## 2021-05-08 ENCOUNTER — Emergency Department (HOSPITAL_BASED_OUTPATIENT_CLINIC_OR_DEPARTMENT_OTHER): Payer: BC Managed Care – PPO

## 2021-05-08 DIAGNOSIS — R059 Cough, unspecified: Secondary | ICD-10-CM | POA: Insufficient documentation

## 2021-05-08 DIAGNOSIS — R0789 Other chest pain: Secondary | ICD-10-CM | POA: Insufficient documentation

## 2021-05-08 DIAGNOSIS — F17210 Nicotine dependence, cigarettes, uncomplicated: Secondary | ICD-10-CM | POA: Diagnosis not present

## 2021-05-08 NOTE — ED Triage Notes (Signed)
C/o left chest wall/ lower rib pain with movt and lifting at gym.

## 2021-05-08 NOTE — Discharge Instructions (Addendum)
You were seen in the emergency department for some left lower rib pain.  You had x-rays of your chest and ribs that did not show any significant abnormality.  Please continue to use ice to the affected area, ibuprofen.  Return to the emergency department if any worsening or concerning symptoms

## 2021-05-08 NOTE — ED Provider Notes (Signed)
[facility] EMERGENCY DEPARTMENT Provider Note   CSN: [id number] Arrival date & time: 05/08/21  2105     History Chief Complaint  Patient presents with   chest wall pain     Timothy Franklin is a 55 y.o. male.  He said he was here 3 to 4 weeks ago with some back spasms that radiated into his left chest.  He ultimately followed up with sports medicine here and received a steroid shot with improvement in his symptoms.  He has been troubled with a cough from a cold and it seems to be bothering that area on his left chest again.  He notes it again when he is trying to get into position to do a bench press.  No shortness of breath fevers.  No nausea or vomiting.  Pain usually improves with some local ice application.  The history is provided by the patient.  Chest Pain Pain location:  L lateral chest Pain quality: aching   Pain radiates to:  Does not radiate Pain severity:  Moderate Onset quality:  Gradual Duration:  3 weeks Timing:  Sporadic Progression:  Unchanged Chronicity:  New Context: lifting and movement   Relieved by: ice. Worsened by:  Certain positions Ineffective treatments:  Rest Associated symptoms: cough   Associated symptoms: no abdominal pain, no fever, no headache, no nausea, no shortness of breath and no vomiting   Risk factors: smoking       Past Medical History:  Diagnosis Date   Acid reflux disease    Thyroid nodule    pt reports this was found on him 05/2014    Patient Active Problem List   Diagnosis Date Noted   Strain of lumbar region 04/22/2021    Past Surgical History:  Procedure Laterality Date   CIRCUMCISION     ESOPHAGOGASTRODUODENOSCOPY         Family History  Problem Relation Age of Onset   Diabetes Mother    Hypertension Mother     Social History   Tobacco Use   Smoking status: Every Day    Packs/day: 0.50    Types: Cigarettes   Smokeless tobacco: Never  Vaping Use   Vaping Use: Never used  Substance Use  Topics   Alcohol use: Yes    Comment: occasional   Drug use: Yes    Types: Marijuana    Comment: 05/04/21 2-3 x week    Home Medications Prior to Admission medications   Medication Sig Start Date End Date Taking? Authorizing Provider  ibuprofen (ADVIL) 200 MG tablet Take 200 mg by mouth every 6 (six) hours as needed.    [provider]  lidocaine (LIDODERM) 5 % Place 1 patch onto the skin daily. Remove & Discard patch within 12 hours or as directed by MD Patient not taking: Reported on 05/04/2021 04/19/21   Theron Arista, PA-C  naproxen (NAPROSYN) 375 MG tablet Take 1 tablet (375 mg total) by mouth 2 (two) times daily. Patient not taking: Reported on 05/04/2021 04/19/21   Theron Arista, PA-C  tadalafil (CIALIS) 5 MG tablet Take 5 mg by mouth daily as needed.    [provider]    Allergies    No known allergies  Review of Systems   Review of Systems  Constitutional:  Negative for fever.  HENT:  Negative for sore throat.   Eyes:  Negative for visual disturbance.  Respiratory:  Positive for cough. Negative for shortness of breath.   Cardiovascular:  Positive for chest pain.  Gastrointestinal:  Negative for abdominal pain, nausea and vomiting.  Genitourinary:  Negative for dysuria.  Musculoskeletal:  Negative for neck pain.  Skin:  Negative for rash.  Neurological:  Negative for headaches.   Physical Exam Updated Vital Signs BP (!) 135/96   Pulse 69   Temp 98.2 F (36.8 C) (Oral)   Resp 16   Ht 5\' 8"  (1.727 m)   Wt 88.5 kg   SpO2 98%   BMI 29.65 kg/m   Physical Exam Vitals and nursing note reviewed.  Constitutional:      Appearance: Normal appearance. He is well-developed.  HENT:     Head: Normocephalic and atraumatic.  Eyes:     Conjunctiva/sclera: Conjunctivae normal.  Cardiovascular:     Rate and Rhythm: Normal rate and regular rhythm.     Heart sounds: No murmur heard. Pulmonary:     Effort: Pulmonary effort is normal. No respiratory distress.      Breath sounds: Normal breath sounds.     Comments: Patient has some reproducible tenderness along his lower anterior rib cage and cartilage. Chest:     Chest wall: Tenderness present.  Abdominal:     Palpations: Abdomen is soft.     Tenderness: There is no abdominal tenderness.  Musculoskeletal:     Cervical back: Neck supple.  Skin:    General: Skin is warm and dry.  Neurological:     General: No focal deficit present.     Mental Status: He is alert.    ED Results / Procedures / Treatments   Labs (all labs ordered are listed, but only abnormal results are displayed) Labs Reviewed - No data to display  EKG None  Radiology DG Ribs Unilateral W/Chest Left  Result Date: 05/08/2021 CLINICAL DATA:  Rib pain EXAM: LEFT RIBS AND CHEST - 3+ VIEW COMPARISON:  02/04/2014 FINDINGS: Single-view chest demonstrates no focal opacity or pleural effusion. Normal cardiomediastinal silhouette. No pneumothorax. Left-sided rib series demonstrates no acute displaced rib fracture. IMPRESSION: Negative. Electronically Signed   By: 02/06/2014 M.D.   On: 05/08/2021 21:48    Procedures Procedures   Medications Ordered in ED Medications - No data to display  ED Course  I have reviewed the triage vital signs and the nursing notes.  Pertinent labs & imaging results that were available during my care of the patient were reviewed by me and considered in my medical decision making (see chart for details).    MDM Rules/Calculators/A&P                          55 year old male here with intermittent left chest wall pain.  He has reproducible pain with palpation of his lower costal margin.  Symptomatically improves with ice.  Worsened with specific positions which twist and turn that area.  Chest x-ray and rib series did not show any acute findings.  Continue symptomatic treatment and outpatient follow-up with his treating providers.  Return instructions discussed  Final Clinical Impression(s) /  ED Diagnoses Final diagnoses:  None    Rx / DC Orders ED Discharge Orders     None        58, MD 05/09/21 1029

## 2021-05-27 NOTE — Telephone Encounter (Addendum)
Message from Dr. Marjory Lies on MRI results from 05/04/2021.  Multilevel severe spinal stenosis including C3-4, C4-5, C5-6 and C6-7.   Recommend expedited neurosurgery consult for evaluation and treatment.   -VRP    I sent my chart message to the pt on this and asked if he would like to pursue neurosurgery referral.

## 2021-05-27 NOTE — Telephone Encounter (Signed)
Pt called stating that he saw his MRI results on his Mychart and would like the RN to give him a call back to discuss.

## 2021-05-28 ENCOUNTER — Telehealth: Payer: Self-pay | Admitting: Diagnostic Neuroimaging

## 2021-05-28 ENCOUNTER — Ambulatory Visit: Payer: BC Managed Care – PPO | Admitting: Family Medicine

## 2021-05-28 NOTE — Addendum Note (Signed)
Addended by: Ann Maki on: 05/28/2021 08:53 AM   Modules accepted: Orders

## 2021-05-28 NOTE — Telephone Encounter (Signed)
Sent to [facility] ph # [phone]. 

## 2021-05-28 NOTE — Telephone Encounter (Signed)
Pt did not read mychart from 05/28/2021 on this. I called and reviewed. Pt verbalized understanding and agreement to referral.  I have placed referral for the pt.

## 2021-06-02 ENCOUNTER — Telehealth: Payer: Self-pay | Admitting: *Deleted

## 2021-06-02 NOTE — Telephone Encounter (Signed)
Called patient to review MRI cervical spine results as he had not read my chart. He stated he was aware and has appointment with neurosurgery next Monday. Patient verbalized understanding, appreciation.

## 2021-08-31 IMAGING — CT CT HEAD W/O CM
3 series · 15 of 47 positions shown, 18 images · non-contrast
Comparison: None.

CLINICAL DATA: Struck head on bed post

EXAM:
CT HEAD WITHOUT CONTRAST
TECHNIQUE: Contiguous axial images were obtained from the base of the skull
through the vertex without intravenous contrast.

[Series 2: head wo · axial · 0.42mm/px · z∈[+1050,+1180]mm · 9 of 32 slices shown, 12 images]
[im 3/32  brain]
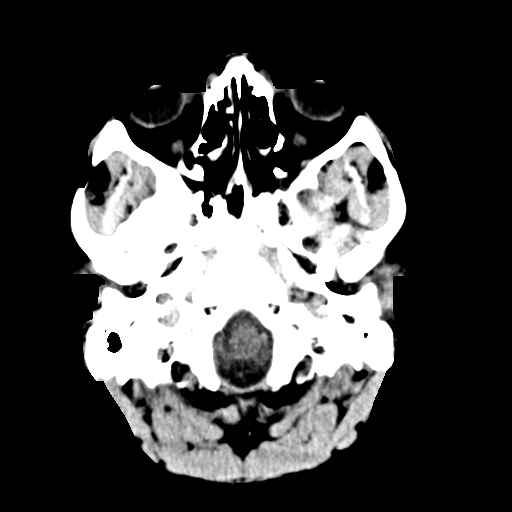
[im 3/32  bone]
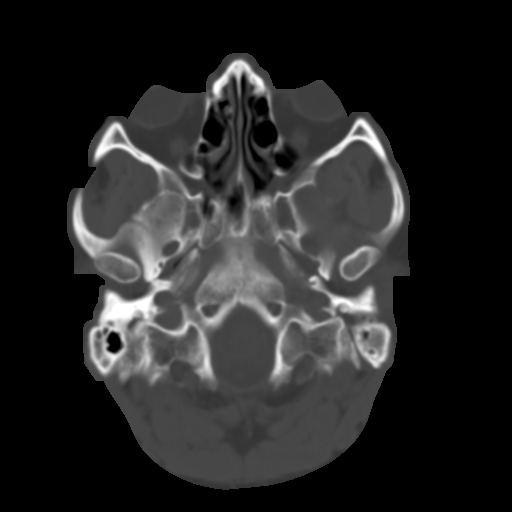
[im 6/32  brain]
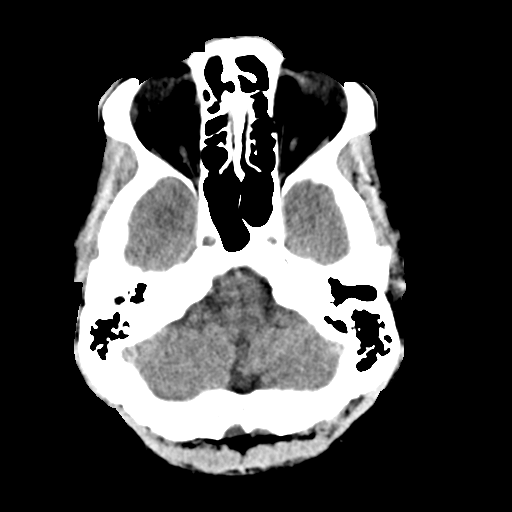
[im 9/32  brain]
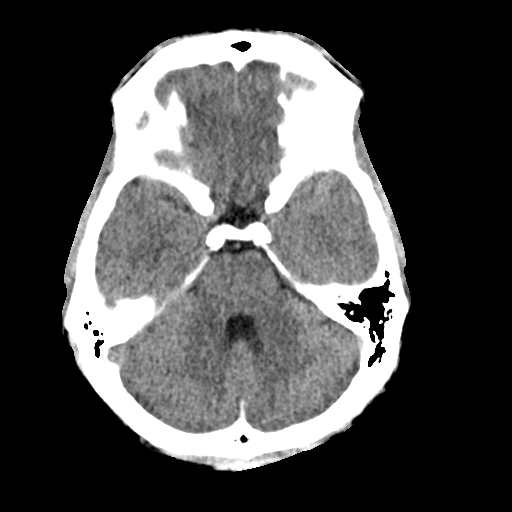
[im 12/32  brain]
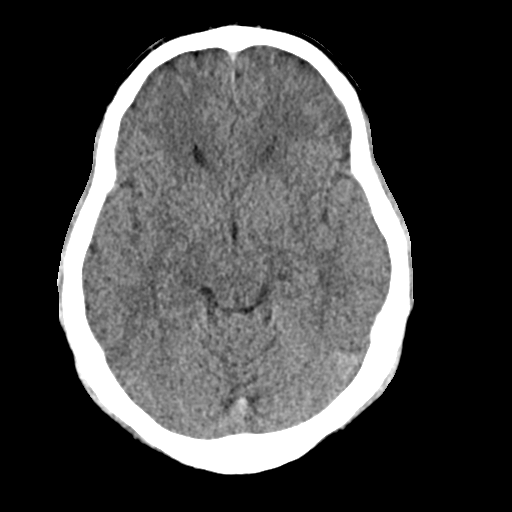
[im 17/32  brain]
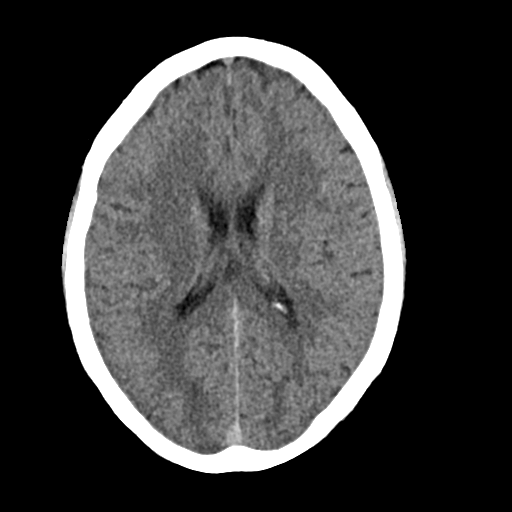
[im 17/32  bone]
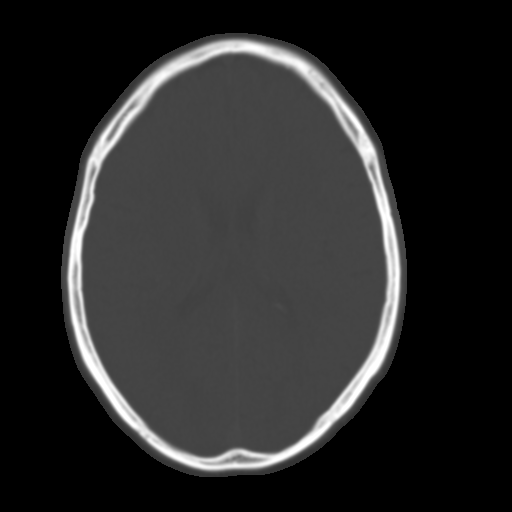
[im 20/32  brain]
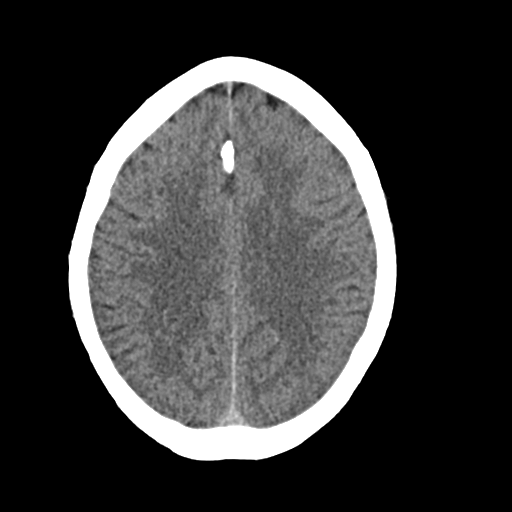
[im 23/32  brain]
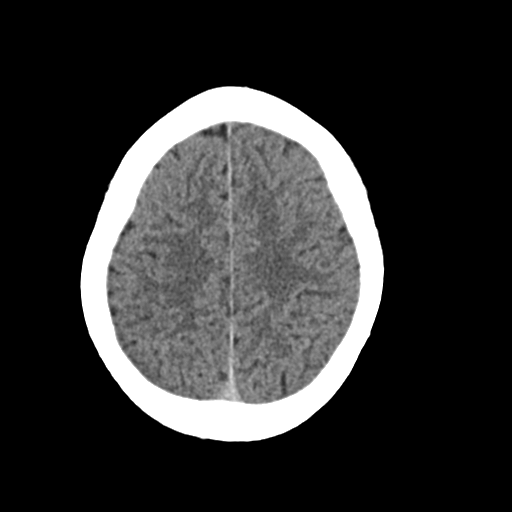
[im 26/32  brain]
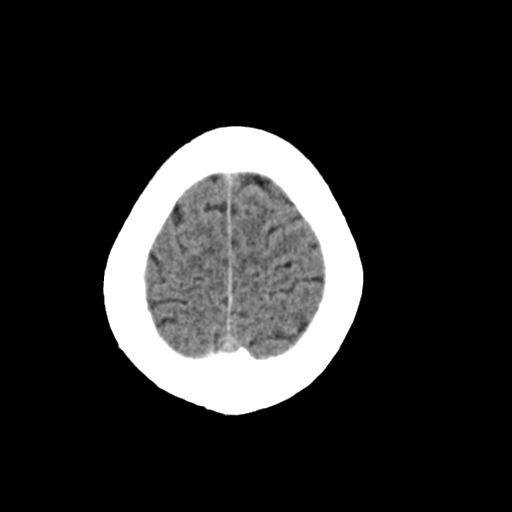
[im 29/32  brain]
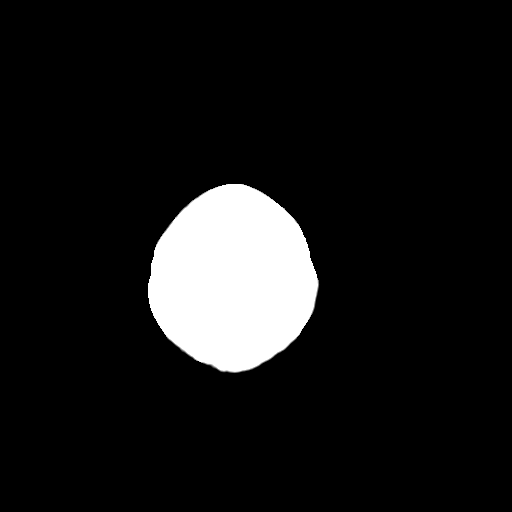
[im 29/32  bone]
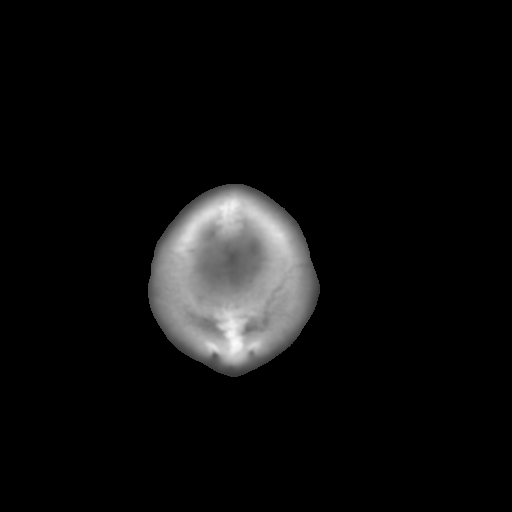

[Series 4: coronal soft · coronal · 0.31mm/px · 3 of 67 slices shown]
[im 23/67  brain]
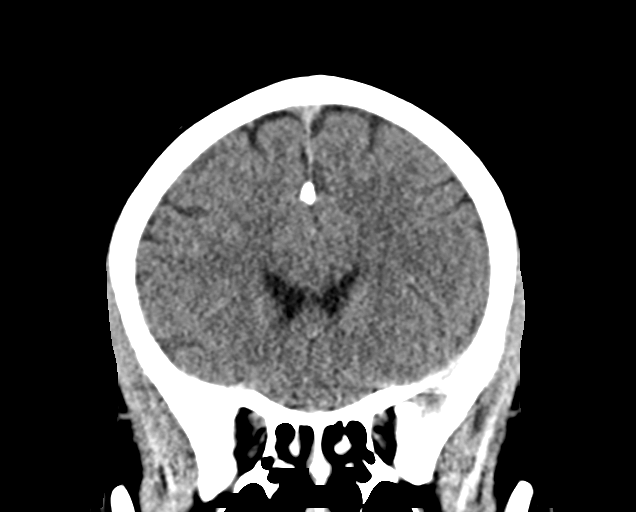
[im 30/67  brain]
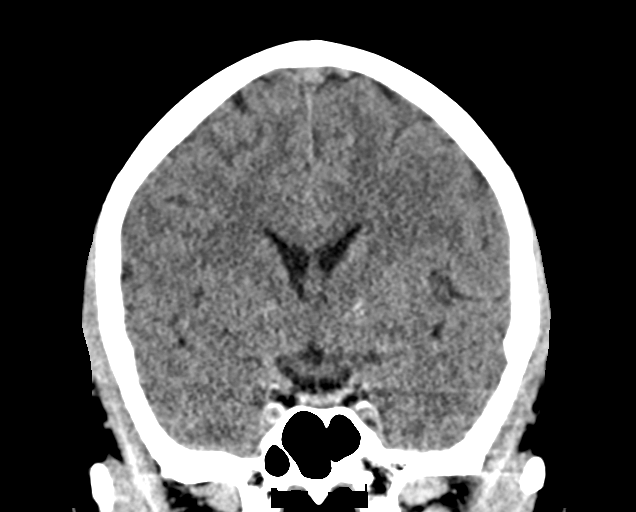
[im 37/67  brain]
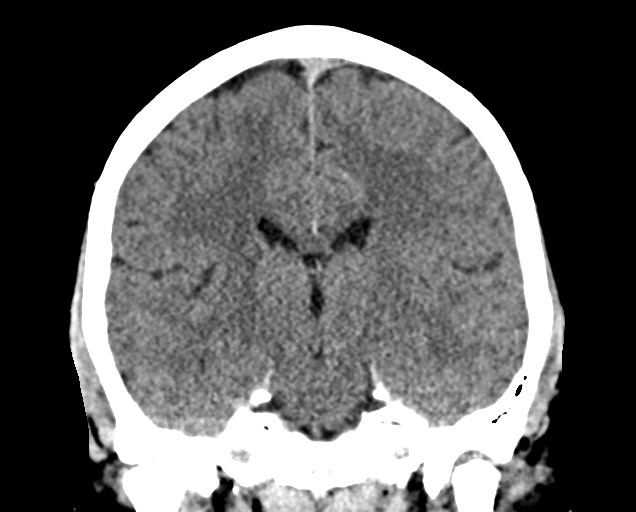

[Series 5: sag soft · sagittal · 0.31mm/px · 3 of 66 slices shown]
[im 22/66  brain]
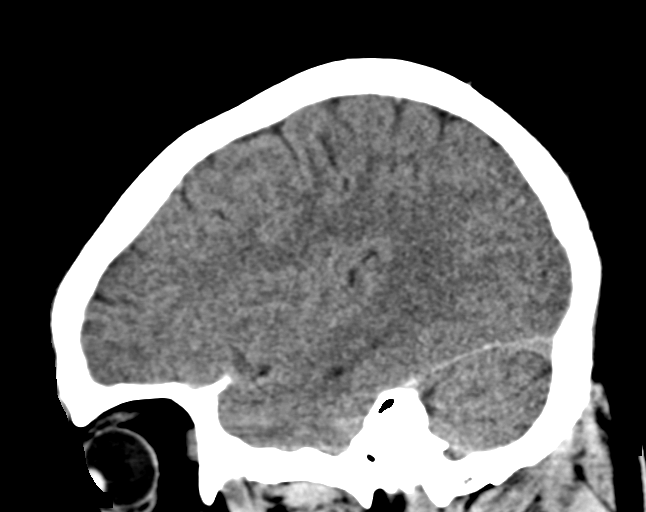
[im 33/66  brain]
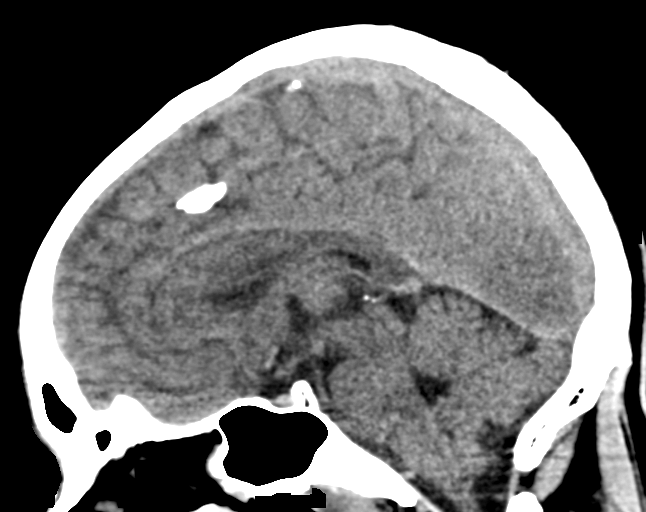
[im 44/66  brain]
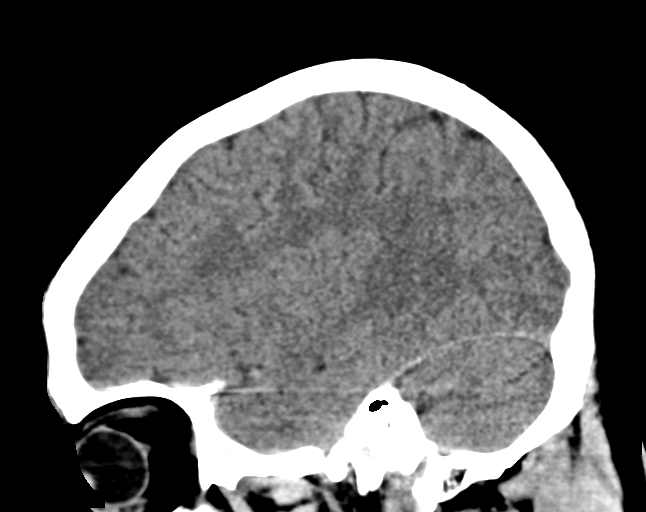

[15 of 47 positions shown; findings below may reference images not displayed]

FINDINGS: Brain: No evidence of acute infarction, hemorrhage, hydrocephalus,
extra-axial collection or mass lesion/mass effect.

Vascular: Atherosclerotic calcification of the carotid siphons. No
unexpected hyperdense vessel.

Skull: Mild left frontotemporal scalp infiltration. No hematoma. No
subjacent calvarial fracture.

Sinuses/Orbits: Paranasal sinuses and mastoid air cells are
predominantly clear. Included orbital structures are unremarkable.

Other: None.
IMPRESSION: No acute intracranial abnormality. Mild left frontotemporal scalp
infiltration. No hematoma or calvarial fracture.

## 2021-09-18 ENCOUNTER — Other Ambulatory Visit: Payer: Self-pay

## 2021-09-18 ENCOUNTER — Encounter (HOSPITAL_BASED_OUTPATIENT_CLINIC_OR_DEPARTMENT_OTHER): Payer: Self-pay | Admitting: Emergency Medicine

## 2021-09-18 ENCOUNTER — Emergency Department (HOSPITAL_BASED_OUTPATIENT_CLINIC_OR_DEPARTMENT_OTHER)
Admission: EM | Admit: 2021-09-18 | Discharge: 2021-09-19 | Disposition: A | Discharge status: Home / Self Care | Payer: BC Managed Care – PPO | Attending: Emergency Medicine | Admitting: Emergency Medicine

## 2021-09-18 ENCOUNTER — Emergency Department (HOSPITAL_BASED_OUTPATIENT_CLINIC_OR_DEPARTMENT_OTHER): Payer: BC Managed Care – PPO

## 2021-09-18 DIAGNOSIS — T148XXA Other injury of unspecified body region, initial encounter: Secondary | ICD-10-CM

## 2021-09-18 DIAGNOSIS — R109 Unspecified abdominal pain: Secondary | ICD-10-CM | POA: Insufficient documentation

## 2021-09-18 DIAGNOSIS — R0789 Other chest pain: Secondary | ICD-10-CM | POA: Diagnosis not present

## 2021-09-18 DIAGNOSIS — X500XXA Overexertion from strenuous movement or load, initial encounter: Secondary | ICD-10-CM | POA: Diagnosis not present

## 2021-09-18 DIAGNOSIS — R079 Chest pain, unspecified: Secondary | ICD-10-CM | POA: Diagnosis present

## 2021-09-18 LAB — CBC
HCT: 39.9 % (ref 39.0–52.0)
Hemoglobin: 14 g/dL (ref 13.0–17.0)
MCH: 32.2 pg (ref 26.0–34.0)
MCHC: 35.1 g/dL (ref 30.0–36.0)
MCV: 91.7 fL (ref 80.0–100.0)
Platelets: 187 10*3/uL (ref 150–400)
RBC: 4.35 MIL/uL (ref 4.22–5.81)
RDW: 12.9 % (ref 11.5–15.5)
WBC: 5.3 10*3/uL (ref 4.0–10.5)
nRBC: 0 % (ref 0.0–0.2)

## 2021-09-18 LAB — BASIC METABOLIC PANEL
Anion gap: 9 (ref 5–15)
BUN: 18 mg/dL (ref 6–20)
CO2: 24 mmol/L (ref 22–32)
Calcium: 8.5 mg/dL — ABNORMAL LOW (ref 8.9–10.3)
Chloride: 103 mmol/L (ref 98–111)
Creatinine, Ser: 1.3 mg/dL — ABNORMAL HIGH (ref 0.61–1.24)
GFR, Estimated: >60 mL/min (ref >60)
Glucose, Bld: 94 mg/dL (ref 70–99)
Potassium: 3.8 mmol/L (ref 3.5–5.1)
Sodium: 136 mmol/L (ref 135–145)

## 2021-09-18 LAB — TROPONIN I (HIGH SENSITIVITY): Troponin I (High Sensitivity): 3 ng/L (ref <18)

## 2021-09-18 MED ORDER — IOHEXOL 300 MG/ML  SOLN: 100.0000 mL | Freq: Once | INTRAMUSCULAR | Status: DC | PRN

## 2021-09-18 MED ORDER — KETOROLAC TROMETHAMINE 30 MG/ML IJ SOLN: 15.0000 mg | Freq: Once | INTRAMUSCULAR | Status: DC

## 2021-09-18 MED ORDER — KETOROLAC TROMETHAMINE 15 MG/ML IJ SOLN
15.0000 mg | Freq: Once | INTRAMUSCULAR | Status: AC
  Administered 2021-09-18: 15 mg via INTRAMUSCULAR
  Filled 2021-09-18: qty 1

## 2021-09-18 MED ORDER — IBUPROFEN 800 MG PO TABS: 800.0000 mg | ORAL_TABLET | Freq: Three times a day (TID) | ORAL | 0 refills | Status: DC

## 2021-09-18 NOTE — ED Provider Notes (Signed)
[facility] EMERGENCY DEPARTMENT Provider Note   CSN: [id number] Arrival date & time: 09/18/21  2042     History  Chief Complaint  Patient presents with   Abdominal Pain   Chest Pain    Timothy Franklin is a 56 y.o. male.  The history is provided by the patient.  Chest Pain Pain location:  L chest and R chest Pain quality comment:  Pulling and he has this in the abdominal wall as well after lifting heavy packages for [employer] Pain radiates to:  Does not radiate Pain severity:  Moderate Onset quality:  Sudden Duration:  1 day Timing:  Constant Progression:  Unchanged Chronicity:  New Context: lifting   Context: not breathing   Relieved by:  Nothing Worsened by:  Nothing Ineffective treatments:  None tried Associated symptoms: no abdominal pain, no AICD problem, no altered mental status, no anxiety, no back pain, no claudication, no cough, no diaphoresis, no dizziness, no fever, no heartburn, no lower extremity edema, no orthopnea, no palpitations, no PND, no shortness of breath, no syncope, no vomiting and no weakness   Risk factors: aortic disease   Risk factors: no high cholesterol   Pain and tugging when lifting.     Home Medications Prior to Admission medications   Medication Sig Start Date End Date Taking? Authorizing Provider  ibuprofen (ADVIL) 200 MG tablet Take 200 mg by mouth every 6 (six) hours as needed.    [provider]  lidocaine (LIDODERM) 5 % Place 1 patch onto the skin daily. Remove & Discard patch within 12 hours or as directed by MD Patient not taking: Reported on 05/04/2021 04/19/21   Theron Arista, PA-C  naproxen (NAPROSYN) 375 MG tablet Take 1 tablet (375 mg total) by mouth 2 (two) times daily. Patient not taking: Reported on 05/04/2021 04/19/21   Theron Arista, PA-C  tadalafil (CIALIS) 5 MG tablet Take 5 mg by mouth daily as needed.    [provider]      Allergies    No known allergies    Review of Systems   Review of  Systems  Constitutional:  Negative for diaphoresis and fever.  HENT:  Negative for ear pain and facial swelling.   Eyes:  Negative for redness.  Respiratory:  Negative for cough and shortness of breath.   Cardiovascular:  Negative for palpitations, orthopnea, claudication, syncope and PND.  Gastrointestinal:  Negative for abdominal pain, constipation, heartburn and vomiting.  Genitourinary:  Negative for difficulty urinating.  Musculoskeletal:  Negative for back pain.  Skin:  Negative for rash.  Neurological:  Negative for dizziness and weakness.  Psychiatric/Behavioral:  Negative for agitation.   All other systems reviewed and are negative.  Physical Exam Updated Vital Signs BP 131/76 (BP Location: Left Arm)    Pulse (!) 54    Temp 98.6 F (37 C) (Oral)    Resp 18    Ht 5\' 8"  (1.727 m)    Wt 89.4 kg    SpO2 97%    BMI 29.95 kg/m  Physical Exam Vitals and nursing note reviewed.  Constitutional:      General: He is not in acute distress.    Appearance: Normal appearance. He is not diaphoretic.  HENT:     Head: Normocephalic and atraumatic.     Nose: Nose normal.  Eyes:     Conjunctiva/sclera: Conjunctivae normal.     Pupils: Pupils are equal, round, and reactive to light.  Cardiovascular:     Rate and Rhythm:  Normal rate and regular rhythm.     Pulses: Normal pulses.     Heart sounds: Normal heart sounds.  Pulmonary:     Effort: Pulmonary effort is normal.     Breath sounds: Normal breath sounds.  Chest:     Chest wall: Tenderness present.  Abdominal:     General: Abdomen is flat. Bowel sounds are normal.     Palpations: Abdomen is soft.     Tenderness: There is no abdominal tenderness. There is no guarding.  Musculoskeletal:        General: Normal range of motion.     Cervical back: Normal range of motion and neck supple.     Right lower leg: No edema.     Left lower leg: No edema.  Skin:    General: Skin is warm and dry.     Capillary Refill: Capillary refill takes  less than 2 seconds.  Neurological:     General: No focal deficit present.     Mental Status: He is alert and oriented to person, place, and time.     Deep Tendon Reflexes: Reflexes normal.  Psychiatric:        Mood and Affect: Mood normal.        Behavior: Behavior normal.    ED Results / Procedures / Treatments   Labs (all labs ordered are listed, but only abnormal results are displayed) Results for orders placed or performed during the hospital encounter of 09/18/21  Basic metabolic panel  Result Value Ref Range   Sodium 136 135 - 145 mmol/L   Potassium 3.8 3.5 - 5.1 mmol/L   Chloride 103 98 - 111 mmol/L   CO2 24 22 - 32 mmol/L   Glucose, Bld 94 70 - 99 mg/dL   BUN 18 6 - 20 mg/dL   Creatinine, Ser 8.54 (H) 0.61 - 1.24 mg/dL   Calcium 8.5 (L) 8.9 - 10.3 mg/dL   GFR, Estimated >62 >70 mL/min   Anion gap 9 5 - 15  CBC  Result Value Ref Range   WBC 5.3 4.0 - 10.5 K/uL   RBC 4.35 4.22 - 5.81 MIL/uL   Hemoglobin 14.0 13.0 - 17.0 g/dL   HCT 35.0 09.3 - 81.8 %   MCV 91.7 80.0 - 100.0 fL   MCH 32.2 26.0 - 34.0 pg   MCHC 35.1 30.0 - 36.0 g/dL   RDW 29.9 37.1 - 69.6 %   Platelets 187 150 - 400 K/uL   nRBC 0.0 0.0 - 0.2 %  Troponin I (High Sensitivity)  Result Value Ref Range   Troponin I (High Sensitivity) 3 <18 ng/L   DG Chest 2 View  Result Date: 09/18/2021 CLINICAL DATA:  Chest and abdominal pain. EXAM: CHEST - 2 VIEW COMPARISON:  Chest x-ray with ribs 05/08/2021 FINDINGS: The heart size and mediastinal contours are within normal limits. Both lungs are clear. The visualized skeletal structures are unremarkable. IMPRESSION: No active cardiopulmonary disease. Electronically Signed   By: Darliss Cheney M.D.   On: 09/18/2021 21:24    EKG  EKG Interpretation  Date/Time:  Friday September 18 2021 21:12:24 EST Ventricular Rate:  61 PR Interval:  158 QRS Duration: 94 QT Interval:  414 QTC Calculation: 416 R Axis:   54 Text Interpretation: Normal sinus rhythm Confirmed by  Nicanor Alcon, Hosea Hanawalt ([id number]) on 09/18/2021 11:50:11 PM         Radiology DG Chest 2 View  Result Date: 09/18/2021 CLINICAL DATA:  Chest and abdominal pain. EXAM: CHEST -  2 VIEW COMPARISON:  Chest x-ray with ribs 05/08/2021 FINDINGS: The heart size and mediastinal contours are within normal limits. Both lungs are clear. The visualized skeletal structures are unremarkable. IMPRESSION: No active cardiopulmonary disease. Electronically Signed   By: Darliss Cheney M.D.   On: 09/18/2021 21:24    Procedures Procedures    Medications Ordered in ED Medications  ketorolac (TORADOL) 15 MG/ML injection 15 mg (has no administration in time range)    ED Course/ Medical Decision Making/ A&P                           Medical Decision Making Pain in the chest and abdominal wall since lifting heavy boxes yesterday, if he takes ibuprofen it goes away as this has happened before.  He did not take ibuprofen so symptoms persist   Amount and/or Complexity of Data Reviewed External Data Reviewed: notes.    Details: reviewed previous ED notes Labs: ordered.    Details: all labs reviewed by me:  Normal CBC, normal electolytes on chemistry panel and normal troponin at 3 Radiology: ordered.    Details: normal chest Xray as reviewed by me ECG/medicine tests: ordered and independent interpretation performed. Decision-making details documented in ED Course.    Details: normal  Risk Prescription drug management. Risk Details: Pain is consistent with MSK pain.  Ruled out for MI with a negative ekg and troponin.  In the setting of ongoing pain > 8 hours one troponin is sufficient to exclude ACS.  I do not believe this is PE.  I do not believe patient needs abdominal imaging.  Start ibuprofen and use good lifting techniques     Final Clinical Impression(s) / ED Diagnoses Final diagnoses:  None   Return for intractable cough, coughing up blood, fevers > 100.4 unrelieved by medication, shortness of breath,  intractable vomiting, chest pain, shortness of breath, weakness, numbness, changes in speech, facial asymmetry, abdominal pain, passing out, Inability to tolerate liquids or food, cough, altered mental status or any concerns. No signs of systemic illness or infection. The patient is nontoxic-appearing on exam and vital signs are within normal limits.  I have reviewed the triage vital signs and the nursing notes. Pertinent labs & imaging results that were available during my care of the patient were reviewed by me and considered in my medical decision making (see chart for details). After history, exam, and medical workup I feel the patient has been appropriately medically screened and is safe for discharge home. Pertinent diagnoses were discussed with the patient. Patient was given return precautions.    Rx / DC Orders ED Discharge Orders     None         Shemeika Starzyk, MD 09/18/21 2353

## 2021-09-18 NOTE — ED Triage Notes (Signed)
Pt stated was seen here for lubricant problem in back that radiated to front. Has been treating at home. Has been working a physical job and thinks pulled a muscle left abdomen that radiates to chest.  ?

## 2021-09-18 NOTE — ED Notes (Signed)
Patient reports no current chest pain or abdominal pain.  Reports pain to L rib cage after working out.  States "I have a lubrication region in my back and it causes problems.  When I work out it feels like it feels on my side.  I just wanted to get it checked out." ?

## 2021-12-14 ENCOUNTER — Other Ambulatory Visit: Payer: Self-pay

## 2021-12-14 ENCOUNTER — Encounter (HOSPITAL_BASED_OUTPATIENT_CLINIC_OR_DEPARTMENT_OTHER): Payer: Self-pay | Admitting: Emergency Medicine

## 2021-12-14 ENCOUNTER — Emergency Department (HOSPITAL_BASED_OUTPATIENT_CLINIC_OR_DEPARTMENT_OTHER)
Admission: EM | Admit: 2021-12-14 | Discharge: 2021-12-14 | Disposition: A | Discharge status: Home / Self Care | Payer: BC Managed Care – PPO | Attending: Emergency Medicine | Admitting: Emergency Medicine

## 2021-12-14 DIAGNOSIS — R1032 Left lower quadrant pain: Secondary | ICD-10-CM | POA: Insufficient documentation

## 2021-12-14 DIAGNOSIS — X500XXA Overexertion from strenuous movement or load, initial encounter: Secondary | ICD-10-CM | POA: Insufficient documentation

## 2021-12-14 DIAGNOSIS — R109 Unspecified abdominal pain: Secondary | ICD-10-CM

## 2021-12-14 DIAGNOSIS — S39011A Strain of muscle, fascia and tendon of abdomen, initial encounter: Secondary | ICD-10-CM | POA: Diagnosis not present

## 2021-12-14 DIAGNOSIS — T148XXA Other injury of unspecified body region, initial encounter: Secondary | ICD-10-CM

## 2021-12-14 DIAGNOSIS — S3991XA Unspecified injury of abdomen, initial encounter: Secondary | ICD-10-CM | POA: Diagnosis present

## 2021-12-14 MED ORDER — NAPROXEN 375 MG PO TABS: 375.0000 mg | ORAL_TABLET | Freq: Two times a day (BID) | ORAL | 0 refills | Status: AC

## 2021-12-14 MED ORDER — METHOCARBAMOL 500 MG PO TABS: 500.0000 mg | ORAL_TABLET | Freq: Two times a day (BID) | ORAL | 0 refills | Status: DC

## 2021-12-14 NOTE — ED Triage Notes (Addendum)
Swelling  to side/ribs to left side and abd since Jan. Denies SOB or pain

## 2021-12-14 NOTE — Discharge Instructions (Signed)
Your exam today was reassuring.  You state you are not having chest pain, shortness of breath.  You state you have had this pain now for months.  You states started last fall and you saw a sports medicine doctor upstairs.  I have sent anti-inflammatory called naproxen into the pharmacy for you along with muscle relaxer called Robaxin.  If you take Robaxin please do not drive as this can make you drowsy.  Of also attached Dr. Jordan Likes information for you.  This is the sports medicine doctor upstairs.  If you have any worsening symptoms, such as abdominal pain, nausea, fever, chest pain, shortness of breath please return to the emergency room for evaluation.

## 2021-12-14 NOTE — ED Provider Notes (Signed)
[facility] EMERGENCY DEPARTMENT Provider Note   CSN: [id number] Arrival date & time: 12/14/21  1036     History  Chief Complaint  Patient presents with   Abdominal Pain    Timothy Franklin is a 56 y.o. male.  56 year old male presents today for evaluation of left-sided abdominal wall pain.  He states that it started fall of last year.  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  He states he saw sports medicine doctor last fall and was given a cortisone injection with improvement in his symptoms.  He states it started on the left side of his back and worked its way around to the left-sided abdominal wall.  He states when he pulled the gate of the truck male in January of this year he had a flareup of this pain.  He states ever since then certain movements, lifting bring back the pain.  He states if he rests, or takes ibuprofen he has resolution of the pain.  He states since this happened he has not followed up with Dr. Jordan Likes sports medicine provider he saw last fall who gave him the cortisone injection.  He denies fever, chills, chest pain, productive cough, abdominal pain, nausea, changes in his bowel habits.  He denies any changes to the pain since onset.  He states he presented today because his wife told him that he could have a anti-inflammatory prescribed in the emergency room.  The history is provided by the patient. No language interpreter was used.      Home Medications Prior to Admission medications   Medication Sig Start Date End Date Taking? Authorizing Provider  methocarbamol (ROBAXIN) 500 MG tablet Take 1 tablet (500 mg total) by mouth 2 (two) times daily. 12/14/21  Yes Nykia Turko, PA-C  naproxen (NAPROSYN) 375 MG tablet Take 1 tablet (375 mg total) by mouth 2 (two) times daily. 12/14/21  Yes Keagan Anthis, PA-C  ibuprofen (ADVIL) 200 MG tablet Take 200 mg by mouth every 6 (six) hours as needed.    [provider]  lidocaine (LIDODERM) 5 % Place 1  patch onto the skin daily. Remove & Discard patch within 12 hours or as directed by MD Patient not taking: Reported on 05/04/2021 04/19/21   Theron Arista, PA-C  tadalafil (CIALIS) 5 MG tablet Take 5 mg by mouth daily as needed.    [provider]      Allergies    No known allergies    Review of Systems   Review of Systems  Constitutional:  Negative for chills and fever.  Respiratory:  Negative for cough and shortness of breath.   Cardiovascular:  Negative for chest pain and leg swelling.  Gastrointestinal:  Positive for abdominal pain. Negative for nausea and vomiting.  Musculoskeletal:  Positive for myalgias.  Neurological:  Negative for light-headedness.  All other systems reviewed and are negative.  Physical Exam Updated Vital Signs BP 136/78 (BP Location: Right Arm)   Pulse 79   Temp 98.1 F (36.7 C) (Oral)   Resp 18   Ht 5\' 8"  (1.727 m)   Wt 89.8 kg   SpO2 98%   BMI 30.11 kg/m  Physical Exam Vitals and nursing note reviewed.  Constitutional:      General: He is not in acute distress.    Appearance: Normal appearance. He is not ill-appearing.  HENT:     Head: Normocephalic and atraumatic.     Nose: Nose normal.  Eyes:  General: No scleral icterus.    Extraocular Movements: Extraocular movements intact.     Conjunctiva/sclera: Conjunctivae normal.  Cardiovascular:     Rate and Rhythm: Normal rate and regular rhythm.     Pulses: Normal pulses.  Pulmonary:     Effort: Pulmonary effort is normal. No respiratory distress.     Breath sounds: Normal breath sounds. No wheezing or rales.  Abdominal:     General: There is no distension.     Palpations: Abdomen is soft.     Tenderness: There is no abdominal tenderness. There is no right CVA tenderness, left CVA tenderness or guarding.  Musculoskeletal:        General: Normal range of motion.     Cervical back: Normal range of motion.     Right lower leg: No edema.     Left lower leg: No edema.      Comments: Cervical, thoracic, lumbar spine without tenderness to palpation.  Without paraspinal muscle tenderness.  He does have mild tenderness to palpation present over the left side of his abdomen.  Without appreciable swelling.  5/5 strength in bilateral lower extremities and extensor and flexor muscle groups.  Skin:    General: Skin is warm and dry.  Neurological:     General: No focal deficit present.     Mental Status: He is alert. Mental status is at baseline.    ED Results / Procedures / Treatments   Labs (all labs ordered are listed, but only abnormal results are displayed) Labs Reviewed - No data to display  EKG None  Radiology No results found.  Procedures Procedures    Medications Ordered in ED Medications - No data to display  ED Course/ Medical Decision Making/ A&P                           Medical Decision Making Risk Prescription drug management.   56 year old male presents today for evaluation of left-sided abdominal wall pain.  This has been ongoing since fall of last year.  He previously had cortisone injection with resolution of his symptoms however he states he reinjured it pulling down the gate of the [employer] truck.  He states pain returns when he is lifting something heavy, certain motions and resolves with ibuprofen and rest.  He came today for prescription of anti-inflammatory.  He is without any signs or symptoms that would be concerning for intra-abdominal etiology other than muscle strain, or ACS.  Previously presented to the emergency room with similar symptoms and had ACS work-up done which was unremarkable.  Strict return precautions discussed.  Discussed further work-up with patient however he defers this today given his symptoms over consistent since onset last fall.  Denies other complaints.  Naproxen and Robaxin prescribed for potential muscle strain.  Follow-up with Dr. [name] provided.  Patient voices understanding and is in agreement with  plan.   Final Clinical Impression(s) / ED Diagnoses Final diagnoses:  Abdominal wall pain  Muscle strain    Rx / DC Orders ED Discharge Orders          Ordered    naproxen (NAPROSYN) 375 MG tablet  2 times daily        12/14/21 1131    methocarbamol (ROBAXIN) 500 MG tablet  2 times daily        12/14/21 1131              Marita Kansasli, Aidenn Skellenger, [place] JerseyPA-C 12/14/21 1137  Melene Plan, DO 12/14/21 1306

## 2022-01-05 ENCOUNTER — Ambulatory Visit: Payer: Self-pay

## 2022-01-05 ENCOUNTER — Encounter: Payer: Self-pay | Admitting: Family Medicine

## 2022-01-05 ENCOUNTER — Ambulatory Visit (INDEPENDENT_AMBULATORY_CARE_PROVIDER_SITE_OTHER): Payer: BC Managed Care – PPO | Admitting: Family Medicine

## 2022-01-05 VITALS — BP 140/82 | Ht 68.0 in | Wt 198.0 lb

## 2022-01-05 DIAGNOSIS — M792 Neuralgia and neuritis, unspecified: Secondary | ICD-10-CM | POA: Insufficient documentation

## 2022-01-05 DIAGNOSIS — S39011A Strain of muscle, fascia and tendon of abdomen, initial encounter: Secondary | ICD-10-CM

## 2022-01-05 MED ORDER — METHOCARBAMOL 500 MG PO TABS: 500.0000 mg | ORAL_TABLET | Freq: Two times a day (BID) | ORAL | 1 refills | Status: DC

## 2022-01-05 NOTE — Patient Instructions (Signed)
Good to see you Please try heat  Please try the exercises and an ab roller  Please try the rib belt   Please send me a message in MyChart with any questions or updates.  Please see me back in 4 weeks.   --Dr. Jordan Likes

## 2022-01-05 NOTE — Progress Notes (Signed)
  Timothy Franklin - 56 y.o. male MRN [id number]  Date of birth: July 05, 1966  SUBJECTIVE:  Including CC & ROS.  No chief complaint on file.   Timothy Franklin is a 56 y.o. male that is presenting with acute on chronic abdominal pain.  The pain is the left upper quadrant.  It feels superficial and it is only occurring when he is lifting or pulling.  He has had this intermittently since October.  He denies any pain when he is not active with lifting.   Review of Systems See HPI   HISTORY: Past Medical, Surgical, Social, and Family History Reviewed & Updated per EMR.   Pertinent Historical Findings include:  Past Medical History:  Diagnosis Date   Acid reflux disease    Thyroid nodule    pt reports this was found on him 05/2014    Past Surgical History:  Procedure Laterality Date   CIRCUMCISION     ESOPHAGOGASTRODUODENOSCOPY       PHYSICAL EXAM:  VS: BP 140/82 (BP Location: Left Arm, Patient Position: Sitting)   Ht 5\' 8"  (1.727 m)   Wt 198 lb (89.8 kg)   BMI 30.11 kg/m  Physical Exam Gen: NAD, alert, cooperative with exam, well-appearing MSK:  Neurovascularly intact    Limited ultrasound: Abdomen and left ribs:  No acute changes in the rectus abdominis. Normal-appearing midclavicular mid axillary rib space. No masses appreciated  Summary: Normal structural appearance  Ultrasound and interpretation by , MD    ASSESSMENT & PLAN:   Strain of abdominal muscle Acute on chronic in nature.  He localizes the pain to the left upper quadrant today.  No structural changes appreciated.  Appears more musculoskeletal as opposed to intra-abdominal.  Unclear if this is more of a radicular component from the thoracic region.  He is also having weakness in his right arm and may be compensating on the left side. -Counseled on home exercise therapy and supportive care. -Rib belt. -Refilled Robaxin. -Could consider physical therapy or further imaging.

## 2022-01-05 NOTE — Assessment & Plan Note (Signed)
Acute on chronic in nature.  He localizes the pain to the left upper quadrant today.  No structural changes appreciated.  Appears more musculoskeletal as opposed to intra-abdominal.  Unclear if this is more of a radicular component from the thoracic region.  He is also having weakness in his right arm and may be compensating on the left side. -Counseled on home exercise therapy and supportive care. -Rib belt. -Refilled Robaxin. -Could consider physical therapy or further imaging.

## 2022-02-02 ENCOUNTER — Encounter: Payer: Self-pay | Admitting: Family Medicine

## 2022-02-02 ENCOUNTER — Ambulatory Visit (INDEPENDENT_AMBULATORY_CARE_PROVIDER_SITE_OTHER): Payer: BC Managed Care – PPO | Admitting: Family Medicine

## 2022-02-02 VITALS — BP 118/80 | Ht 68.0 in | Wt 198.0 lb

## 2022-02-02 DIAGNOSIS — S39011D Strain of muscle, fascia and tendon of abdomen, subsequent encounter: Secondary | ICD-10-CM | POA: Diagnosis not present

## 2022-02-02 DIAGNOSIS — R1032 Left lower quadrant pain: Secondary | ICD-10-CM | POA: Diagnosis not present

## 2022-02-02 NOTE — Assessment & Plan Note (Signed)
Acute on chronic in nature.  Symptoms been present since last October.  He had x-rays performed at that time that were negative.  Continues to have the pain with any dynamic motion.  Concern for hernia versus ongoing rectus tear. -Counseled on home exercise therapy and supportive care. -Counseled on rib belt -CT abdomen and pelvis with oral contrast to evaluate for rectus tear versus hernia and for presurgical planning.

## 2022-02-02 NOTE — Progress Notes (Signed)
  Vahan Wadsworth - 56 y.o. male MRN [id number]  Date of birth: 1965-09-13  SUBJECTIVE:  Including CC & ROS.  No chief complaint on file.   Blaze Hart is a 56 y.o. male that is presenting with acute on chronic left-sided anterior abdominal pain.  The pain is occurring in the anterior aspect anytime that he is lifting or twisting or pulling.  He does get improvement with the rib belt.    Review of Systems See HPI   HISTORY: Past Medical, Surgical, Social, and Family History Reviewed & Updated per EMR.   Pertinent Historical Findings include:  Past Medical History:  Diagnosis Date   Acid reflux disease    Thyroid nodule    pt reports this was found on him 05/2014    Past Surgical History:  Procedure Laterality Date   CIRCUMCISION     ESOPHAGOGASTRODUODENOSCOPY       PHYSICAL EXAM:  VS: BP 118/80 (BP Location: Left Arm, Patient Position: Sitting)   Ht 5\' 8"  (1.727 m)   Wt 198 lb (89.8 kg)   BMI 30.11 kg/m  Physical Exam Gen: NAD, alert, cooperative with exam, well-appearing MSK:  Neurovascularly intact       ASSESSMENT & PLAN:   Strain of abdominal muscle Acute on chronic in nature.  Symptoms been present since last October.  He had x-rays performed at that time that were negative.  Continues to have the pain with any dynamic motion.  Concern for hernia versus ongoing rectus tear. -Counseled on home exercise therapy and supportive care. -Counseled on rib belt -CT abdomen and pelvis with oral contrast to evaluate for rectus tear versus hernia and for presurgical planning.

## 2022-02-02 NOTE — Patient Instructions (Signed)
Good to see you Please alternate heat and ice  Please continue the belt  We'll get the MRI at [facility] imaging   Please send me a message in MyChart with any questions or updates.  We'll setup a virtual visit once the MRi is resulted.   --Dr. Jordan Likes

## 2022-02-10 ENCOUNTER — Telehealth: Payer: Self-pay | Admitting: *Deleted

## 2022-02-10 NOTE — Telephone Encounter (Signed)
-----   Message from Lizbeth Bark sent at 02/10/2022  8:17 AM EDT ----- Regarding: phone message Contact: [phone] Pt called to check on status of MRI approval? I didn't see an MRI order but did see CT scan, maybe that is what he is talking about?  Is this pending approval from insurance? Or can he call to schedule it at [facility]?

## 2022-02-10 NOTE — Telephone Encounter (Signed)
I spoke to pt- I informed him the imaging Dr. Jordan Likes ordered was a CT abd/pelvis and precert is not needed. Per Jazmin H. The imaging facility will reach out to him to schedule.

## 2022-02-17 ENCOUNTER — Encounter: Payer: Self-pay | Admitting: Family Medicine

## 2022-02-17 ENCOUNTER — Telehealth (INDEPENDENT_AMBULATORY_CARE_PROVIDER_SITE_OTHER): Payer: BC Managed Care – PPO | Admitting: Family Medicine

## 2022-02-17 DIAGNOSIS — M792 Neuralgia and neuritis, unspecified: Secondary | ICD-10-CM | POA: Diagnosis not present

## 2022-02-17 MED ORDER — GABAPENTIN 300 MG PO CAPS: 300.0000 mg | ORAL_CAPSULE | Freq: Three times a day (TID) | ORAL | 1 refills | Status: AC

## 2022-02-17 NOTE — Assessment & Plan Note (Signed)
Continues to have pain with lifting.  He does get improvement with a brace.  CT of his abdomen and pelvis was normal.  This may be more of a nerve pain emanating from the thoracic and lumbar region -Counseled on home exercise therapy and supportive care. -Initiate gabapentin. -May need to consider further imaging.

## 2022-02-17 NOTE — Progress Notes (Signed)
Virtual Visit via Telephone Note  I connected with Timothy Franklin on 02/17/22 at  9:50 AM EDT by telephone and verified that I am speaking with the correct person using two identifiers.  Location: Patient: vehicle Provider: office   I discussed the limitations, risks, security and privacy concerns of performing an evaluation and management service by telephone and the availability of in person appointments. I also discussed with the patient that there may be a patient responsible charge related to this service. The patient expressed understanding and agreed to proceed.   History of Present Illness:  Timothy Franklin is a 56 year old male that is following up after the CT of his abdomen and pelvis.  This was demonstrating no abnormalities.  He continues to have the pain within his left flank and left abdomen.    Observations/Objective:   Assessment and Plan:  Neuropathic pain:  Continues to have pain with lifting.  He does get improvement with a brace.  CT of his abdomen and pelvis was normal.  This may be more of a nerve pain emanating from the thoracic and lumbar region -Counseled on home exercise therapy and supportive care. -Initiate gabapentin. -May need to consider further imaging.  Follow Up Instructions:    I discussed the assessment and treatment plan with the patient. The patient was provided an opportunity to ask questions and all were answered. The patient agreed with the plan and demonstrated an understanding of the instructions.   The patient was advised to call back or seek an in-person evaluation if the symptoms worsen or if the condition fails to improve as anticipated.  I provided 6 minutes of non-face-to-face time during this encounter.   Clare Gandy, MD

## 2022-04-20 ENCOUNTER — Ambulatory Visit: Payer: BC Managed Care – PPO | Admitting: Family Medicine

## 2022-04-27 ENCOUNTER — Ambulatory Visit: Payer: BC Managed Care – PPO | Admitting: Family Medicine

## 2022-05-11 ENCOUNTER — Encounter: Payer: Self-pay | Admitting: Family Medicine

## 2022-05-11 ENCOUNTER — Ambulatory Visit (INDEPENDENT_AMBULATORY_CARE_PROVIDER_SITE_OTHER): Payer: BC Managed Care – PPO | Admitting: Family Medicine

## 2022-05-11 VITALS — BP 130/80 | Ht 68.0 in | Wt 198.0 lb

## 2022-05-11 DIAGNOSIS — M792 Neuralgia and neuritis, unspecified: Secondary | ICD-10-CM | POA: Diagnosis not present

## 2022-05-11 MED ORDER — IBUPROFEN 800 MG PO TABS: 800.0000 mg | ORAL_TABLET | Freq: Two times a day (BID) | ORAL | 1 refills | Status: DC | PRN

## 2022-05-11 NOTE — Patient Instructions (Signed)
Good to see you  Please try adjusting your weight lifting  Please use the ibuprofen as needed Please send me a message in MyChart with any questions or updates.  Please see me back in 6 weeks or as needed if better.   --Dr. Raeford Razor

## 2022-05-11 NOTE — Progress Notes (Signed)
  Timothy Franklin - 56 y.o. male MRN [id number]  Date of birth: 1965/10/11  SUBJECTIVE:  Including CC & ROS.  No chief complaint on file.   Timothy Franklin is a 56 y.o. male that is following up for his left-sided radicular type pain.  Previous imaging has been normal.  He does get improvement with ibuprofen that he takes occasionally.  He notices his pain gets worse with any form of lifting or activity.  He is continuing to have altered sensation in his right arm and is considering cervical neck surgery.   Review of Systems See HPI   HISTORY: Past Medical, Surgical, Social, and Family History Reviewed & Updated per EMR.   Pertinent Historical Findings include:  Past Medical History:  Diagnosis Date   Acid reflux disease    Thyroid nodule    pt reports this was found on him 05/2014    Past Surgical History:  Procedure Laterality Date   CIRCUMCISION     ESOPHAGOGASTRODUODENOSCOPY       PHYSICAL EXAM:  VS: BP 130/80 (BP Location: Left Arm, Patient Position: Sitting)   Ht 5\' 8"  (1.727 m)   Wt 198 lb (89.8 kg)   BMI 30.11 kg/m  Physical Exam Gen: NAD, alert, cooperative with exam, well-appearing MSK:  Neurovascularly intact       ASSESSMENT & PLAN:   Neuropathic pain Continues to have pain intermittently.  Does appear to be more radicular in fashion emanating from the lumbar spine. -Counseled on home exercise therapy and supportive care. -ibuprofen  -Could consider further imaging of the lumbar spine.

## 2022-05-11 NOTE — Assessment & Plan Note (Signed)
Continues to have pain intermittently.  Does appear to be more radicular in fashion emanating from the lumbar spine. -Counseled on home exercise therapy and supportive care. -ibuprofen  -Could consider further imaging of the lumbar spine.

## 2022-06-22 ENCOUNTER — Ambulatory Visit: Payer: BC Managed Care – PPO | Admitting: Family Medicine

## 2022-07-11 ENCOUNTER — Encounter (HOSPITAL_BASED_OUTPATIENT_CLINIC_OR_DEPARTMENT_OTHER): Payer: Self-pay | Admitting: Emergency Medicine

## 2022-07-11 ENCOUNTER — Emergency Department (HOSPITAL_BASED_OUTPATIENT_CLINIC_OR_DEPARTMENT_OTHER)
Admission: EM | Admit: 2022-07-11 | Discharge: 2022-07-11 | Disposition: A | Discharge status: Home / Self Care | Payer: BC Managed Care – PPO | Attending: Emergency Medicine | Admitting: Emergency Medicine

## 2022-07-11 ENCOUNTER — Other Ambulatory Visit: Payer: Self-pay

## 2022-07-11 DIAGNOSIS — K5900 Constipation, unspecified: Secondary | ICD-10-CM | POA: Insufficient documentation

## 2022-07-11 DIAGNOSIS — K625 Hemorrhage of anus and rectum: Secondary | ICD-10-CM | POA: Diagnosis present

## 2022-07-11 DIAGNOSIS — K649 Unspecified hemorrhoids: Secondary | ICD-10-CM | POA: Diagnosis not present

## 2022-07-11 LAB — CBC WITH DIFFERENTIAL/PLATELET
Abs Immature Granulocytes: 0.01 10*3/uL (ref 0.00–0.07)
Basophils Absolute: 0.1 10*3/uL (ref 0.0–0.1)
Basophils Relative: 1 %
Eosinophils Absolute: 0.2 10*3/uL (ref 0.0–0.5)
Eosinophils Relative: 3 %
HCT: 42.1 % (ref 39.0–52.0)
Hemoglobin: 14.7 g/dL (ref 13.0–17.0)
Immature Granulocytes: 0 %
Lymphocytes Relative: 45 %
Lymphs Abs: 2.5 10*3/uL (ref 0.7–4.0)
MCH: 32.5 pg (ref 26.0–34.0)
MCHC: 34.9 g/dL (ref 30.0–36.0)
MCV: 92.9 fL (ref 80.0–100.0)
Monocytes Absolute: 0.4 10*3/uL (ref 0.1–1.0)
Monocytes Relative: 7 %
Neutro Abs: 2.5 10*3/uL (ref 1.7–7.7)
Neutrophils Relative %: 44 %
Platelets: 201 10*3/uL (ref 150–400)
RBC: 4.53 MIL/uL (ref 4.22–5.81)
RDW: 13 % (ref 11.5–15.5)
WBC: 5.6 10*3/uL (ref 4.0–10.5)
nRBC: 0 % (ref 0.0–0.2)

## 2022-07-11 LAB — COMPREHENSIVE METABOLIC PANEL
ALT: 16 U/L (ref 0–44)
AST: 20 U/L (ref 15–41)
Albumin: 3.9 g/dL (ref 3.5–5.0)
Alkaline Phosphatase: 46 U/L (ref 38–126)
Anion gap: 4 — ABNORMAL LOW (ref 5–15)
BUN: 13 mg/dL (ref 6–20)
CO2: 27 mmol/L (ref 22–32)
Calcium: 8.9 mg/dL (ref 8.9–10.3)
Chloride: 109 mmol/L (ref 98–111)
Creatinine, Ser: 1.05 mg/dL (ref 0.61–1.24)
GFR, Estimated: >60 mL/min (ref >60)
Glucose, Bld: 106 mg/dL — ABNORMAL HIGH (ref 70–99)
Potassium: 4.1 mmol/L (ref 3.5–5.1)
Sodium: 140 mmol/L (ref 135–145)
Total Bilirubin: 0.6 mg/dL (ref 0.3–1.2)
Total Protein: 7 g/dL (ref 6.5–8.1)

## 2022-07-11 NOTE — Discharge Instructions (Signed)
Recommend preparation H wipes

## 2022-07-11 NOTE — ED Provider Notes (Signed)
[facility] EMERGENCY DEPARTMENT Provider Note   CSN: [id number] Arrival date & time: 07/11/22  1155     History  Chief Complaint  Patient presents with   Rectal Bleeding    Timothy Franklin is a 56 y.o. male.  Patient here because he has noticed some blood on his toilet paper last few times he has gone to the bathroom and wiped.  He has had no rectal pain.  No melena or hematochezia.  No major medical problems.  No abdominal pain, nausea, vomiting.  Nothing makes it worse or better.  Denies any fevers or chills.  May be some constipation.  Has never had a hemorrhoid before.  The history is provided by the patient.       Home Medications Prior to Admission medications   Medication Sig Start Date End Date Taking? Authorizing Provider  gabapentin (NEURONTIN) 300 MG capsule Take 1 capsule (300 mg total) by mouth 3 (three) times daily. Patient not taking: Reported on 05/11/2022 02/17/22   Myra Rude, MD  ibuprofen (ADVIL) 800 MG tablet Take 1 tablet (800 mg total) by mouth 2 (two) times daily as needed. 05/11/22   Myra Rude, MD  lidocaine (LIDODERM) 5 % Place 1 patch onto the skin daily. Remove & Discard patch within 12 hours or as directed by MD Patient not taking: Reported on 05/04/2021 04/19/21   Theron Arista, PA-C  methocarbamol (ROBAXIN) 500 MG tablet Take 1 tablet (500 mg total) by mouth 2 (two) times daily. 01/05/22   Myra Rude, MD  naproxen (NAPROSYN) 375 MG tablet Take 1 tablet (375 mg total) by mouth 2 (two) times daily. 12/14/21   Karie Mainland, Amjad, PA-C  tadalafil (CIALIS) 5 MG tablet Take 5 mg by mouth daily as needed.    [provider]      Allergies    No known allergies    Review of Systems   Review of Systems  Physical Exam Updated Vital Signs BP 111/80 (BP Location: Left Arm)   Pulse 62   Temp 98 F (36.7 C) (Oral)   Resp 18   Ht 5\' 8"  (1.727 m)   Wt 88.5 kg   SpO2 97%   BMI 29.65 kg/m  Physical Exam Vitals and  nursing note reviewed.  Constitutional:      General: He is not in acute distress.    Appearance: He is well-developed.  HENT:     Head: Normocephalic and atraumatic.  Eyes:     Extraocular Movements: Extraocular movements intact.     Conjunctiva/sclera: Conjunctivae normal.     Pupils: Pupils are equal, round, and reactive to light.  Cardiovascular:     Rate and Rhythm: Normal rate and regular rhythm.     Heart sounds: No murmur heard. Pulmonary:     Effort: Pulmonary effort is normal. No respiratory distress.     Breath sounds: Normal breath sounds.  Abdominal:     General: Abdomen is flat.     Palpations: Abdomen is soft.     Tenderness: There is no abdominal tenderness.  Genitourinary:    Comments: Small hemorrhoid on rectal exam Musculoskeletal:        General: No swelling.     Cervical back: Neck supple.  Skin:    General: Skin is warm and dry.     Capillary Refill: Capillary refill takes less than 2 seconds.  Neurological:     Mental Status: He is alert.  Psychiatric:  Mood and Affect: Mood normal.     ED Results / Procedures / Treatments   Labs (all labs ordered are listed, but only abnormal results are displayed) Labs Reviewed  COMPREHENSIVE METABOLIC PANEL - Abnormal; Notable for the following components:      Result Value   Glucose, Bld 106 (*)    Anion gap 4 (*)    All other components within normal limits  CBC WITH DIFFERENTIAL/PLATELET  OCCULT BLOOD X 1 CARD TO LAB, STOOL    EKG None  Radiology No results found.  Procedures Procedures    Medications Ordered in ED Medications - No data to display  ED Course/ Medical Decision Making/ A&P                           Medical Decision Making Amount and/or Complexity of Data Reviewed Labs: ordered.   Timothy Franklin is here with blood on toilet paper.  Normal vitals.  No fever.  He has a small hemorrhoid on exam.  Overall he appears well.  Is not having any melena or hematochezia.  Is  not on blood thinners.  He is no abdominal pain.  Well-appearing.  Given reassurance likely some blood from local irritation when he wipes.  Recommend flushable wipes, MiraLAX.  Discharged in good condition.  Understands return precautions.  This chart was dictated using voice recognition software.  Despite best efforts to proofread,  errors can occur which can change the documentation meaning.         Final Clinical Impression(s) / ED Diagnoses Final diagnoses:  Hemorrhoids, unspecified hemorrhoid type    Rx / DC Orders ED Discharge Orders     None         Lennice Sites, DO 07/11/22 1600

## 2022-07-11 NOTE — ED Triage Notes (Signed)
Pt c/o noticed blood on tissue when he wiped last night.

## 2022-09-11 ENCOUNTER — Other Ambulatory Visit: Payer: Self-pay | Admitting: Family Medicine

## 2022-10-15 ENCOUNTER — Other Ambulatory Visit: Payer: Self-pay | Admitting: Family Medicine

## 2022-11-01 ENCOUNTER — Encounter: Payer: Self-pay | Admitting: *Deleted

## 2022-11-18 ENCOUNTER — Other Ambulatory Visit: Payer: Self-pay | Admitting: Family Medicine

## 2023-08-04 NOTE — Therapy (Signed)
OUTPATIENT PHYSICAL THERAPY CERVICAL AND THORACOLUMBAR EVALUATION   Patient Name: Timothy Franklin MRN: [id number] DOB:[birth year], 58 y.o., male Today's Date: 08/05/2023   END OF SESSION:  PT End of Session - 08/05/23 0922     Visit Number 1    Date for PT Re-Evaluation 09/30/23    Authorization Type [employer]    PT Start Time [phone]    PT Stop Time 1018    PT Time Calculation (min) 56 min    Activity Tolerance Patient tolerated treatment well    Behavior During Therapy [facility] for tasks assessed/performed             Past Medical History:  Diagnosis Date   Acid reflux disease    Thyroid nodule    pt reports this was found on him 05/2014   Past Surgical History:  Procedure Laterality Date   CIRCUMCISION     ESOPHAGOGASTRODUODENOSCOPY     Patient Active Problem List   Diagnosis Date Noted   Neuropathic pain 01/05/2022   Strain of lumbar region 04/22/2021    PCP:  No Pcp  REFERRING PROVIDER: Dawley, Alan Mulder, DO   REFERRING DIAG: M48.02 (ICD-10-CM) - Spinal stenosis in cervical region  "Also for treatment of left thoracic spasms"  THERAPY DIAG:  Radiculopathy, cervical region  Pain in thoracic spine  Muscle spasm of back  Abnormal posture  Muscle weakness (generalized)  RATIONALE FOR EVALUATION AND TREATMENT: Rehabilitation  ONSET DATE: ~3 yrs  NEXT MD VISIT:  PRN   SUBJECTIVE:                                                                                                                                                                                                         SUBJECTIVE STATEMENT: Pt reports he has a adult son with CP who is completely dependent for all mobility, causing him to have to lift his son for all transfers (mechanical lift available, although patient reports only his wife typically uses it).  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  All  of this triggers increased tightness and stiffness in his back.  Pain originated ~3 yrs ago upon returning to work after a 2 week vacation - first noted a cramp in his abdominals then pain spread to his back.  Also noted R UE weakness around the same time. Was referred to a spine specialist where he was diagnosed with cervical stenosis and mild myelopathy.  He originally had some numbness  and tingling in B UE but not recently. He wants to avoid surgery.  PAIN: Are you having pain? Yes: NPRS scale: 2-3/10 "tightness" (not "pain")  Pain location: L>R thoracolumbar spine wrapping around ribs on L Pain description: tight, stiff Aggravating factors: pulling in/out truck, bending and lifting, jumping (when happy at church), prolonged sitting Relieving factors: ibuprofen  PERTINENT HISTORY:  Cervical stenosis with mild myelopathy, neuropathic pain, acid reflux  PRECAUTIONS: None  HAND DOMINANCE: Right  RED FLAGS: None  WEIGHT BEARING RESTRICTIONS: No  FALLS:  Has patient fallen in last 6 months? 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   There is multilevel neuroforaminal stenosis.   PATIENT SURVEYS:  NDI 2 / 50 = 4.0 %  - Patient reporting activities not limited by pain normal, only muscle tightness  SCREENING FOR RED FLAGS: Bowel or bladder incontinence: No Spinal tumors: No Cauda equina syndrome: No Compression fracture: No Abdominal  aneurysm: No  COGNITION: Overall cognitive status: Within functional limits for tasks assessed     SENSATION: WFL  POSTURE:  rounded shoulders, forward head, increased thoracic kyphosis, and flexed trunk   PALPATION: Increased muscle tension throughout L>R cervical and thoracolumbar paraspinals, periscapular muscles and QL  CERVICAL ROM:   Active ROM Eval  Flexion 62  Extension 34  Right lateral flexion WNL  Left lateral flexion WNL  Right rotation WNL  Left rotation WNL    (Blank rows = not tested)  UPPER EXTREMITY ROM:  Active ROM Right eval Left eval  Shoulder flexion 89 116  Shoulder extension 45 49  Shoulder abduction 115 125  Shoulder adduction    Shoulder internal rotation FIR L3 FIR L2  Shoulder external rotation FER top of head FER top of head  Elbow flexion    Elbow extension    Wrist flexion    Wrist extension    Wrist ulnar deviation    Wrist radial deviation    Wrist pronation    Wrist supination      (Blank rows = not tested)  UPPER EXTREMITY MMT:  MMT Right* eval Left*  eval  Shoulder flexion 4- 4  Shoulder extension 4+ 4+  Shoulder abduction 4- 4  Shoulder adduction    Shoulder internal rotation 5 5  Shoulder external rotation 4- 4-  Middle trapezius 4 4  Lower trapezius 3- 3-  Elbow flexion    Elbow extension    Wrist flexion    Wrist extension    Wrist ulnar deviation    Wrist radial deviation    Wrist pronation    Wrist supination    Grip strength    (Blank rows = not tested, * - for available ROM)  THORACOLUMBAR ROM:  Grossly WFL but flexion and B lateral flexion trigger increase muscle "tightness"  MUSCLE LENGTH: Hamstrings: mild tight L>R ITB: WFL Piriformis: mild/mod tight B Hip flexors: mild tight B Quads: mild/mod tight B Heelcord: NT  LOWER EXTREMITY ROM:    Grossly WFL other than  mild restriction in hip IR/ER  LOWER EXTREMITY MMT:    MMT Right eval Left eval  Hip flexion 5 5  Hip extension 4+ 4+  Hip  abduction 4+ 4  Hip adduction 5 5  Hip internal rotation 5 4  Hip external rotation 5 5  Knee flexion 5 5  Knee extension 5 5  Ankle dorsiflexion 5 5  Ankle plantarflexion    Ankle inversion    Ankle eversion      (Blank rows = not tested)   TODAY'S TREATMENT:   08/05/2023 - Eval THERAPEUTIC EXERCISE: to improve flexibility, strength and mobility.  Demonstration, verbal and tactile cues throughout for technique.  3-way doorway pec stretch (60/90/120) x 30" each Standing RTB scap retraction + B shoulder ER 10 x 3" with back along door frame to promote upright posture and scapular engagement Standing RTB/GTB scap retraction + B shoulder horizontal ABD 10 x 3" with back along door frame to promote upright posture and scapular engagement Standing GTB scap retraction + B shoulder horizontal ABD alternating diagonals 10 x 3" with back along door frame to promote upright posture and scapular engagement   PATIENT EDUCATION:  Education details: PT eval findings, anticipated POC, initial HEP, postural awareness, and role of DN  Person educated: Patient Education method: Explanation, Demonstration, Verbal cues, Handouts, and information provided for MedBridgeGO app access Education comprehension: verbalized understanding, returned demonstration, verbal cues required, and needs further education  HOME EXERCISE PROGRAM: Access Code: [email or web address] URL: [email or web address] Date: 08/05/2023 Prepared by: Glenetta Hew  Exercises - Doorway Pec Stretch at 90 Degrees Abduction  - 2-3 x daily - 7 x weekly - 3 reps - 30 sec hold - Doorway Pec Stretch at 60 Elevation  - 2-3 x daily - 7 x weekly - 3 reps - 30 sec hold - Doorway Pec Stretch at 120 Degrees Abduction  - 2-3 x daily - 7 x weekly - 3 reps - 30 sec hold - Shoulder External Rotation and Scapular Retraction with Resistance  - 1 x daily - 7 x weekly - 2 sets - 10 reps - 3-5 sec hold - Standing Shoulder Horizontal Abduction with  Resistance  - 1 x daily - 7 x weekly - 2 sets - 10 reps - 3 sec hold - Standing Shoulder Diagonal Horizontal Abduction 60/120 Degrees with Resistance  - 1 x daily - 7 x weekly - 2 sets - 10 reps - 3 sec hold  Patient Education - Trigger Point Dry Needling   ASSESSMENT:  CLINICAL IMPRESSION: Timothy Franklin is a 58 y.o. male who was referred to physical therapy for evaluation and treatment for cervical spinal stenosis with myelopathy as well as thoracic muscle spasms.  Patient reports onset of L sided thoracic back and neck pain with R UE weakness and BUE radiculopathy with numbness and tingling beginning ~3 years ago.  Pain and radicular symptoms no longer as much of an issue, although RUE weakness persists and patient reports worsening sensation of stiffness and tightness throughout the thoracolumbar musculature.  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  Patient has deficits including abnormal posture, limited cervical extension and B shoulder ROM, impaired postural muscle flexibility, impaired postural and B shoulder strength, and TTP with abnormal muscle tension as described above which are interfering with ADLs and are impacting quality of life.  Arsh "Alinda Money" will benefit from skilled PT to address above deficits to  improve mobility and activity tolerance with decreased pain interference.  OBJECTIVE IMPAIRMENTS: decreased activity tolerance, decreased knowledge of condition, decreased ROM, decreased strength, increased fascial restrictions, impaired perceived functional ability, increased muscle spasms, impaired flexibility, impaired UE functional use, improper body mechanics, postural dysfunction, and pain.   ACTIVITY LIMITATIONS: carrying, lifting, bending, sitting, reach over head, and caring for others  PARTICIPATION LIMITATIONS: cleaning, laundry, interpersonal relationship, driving, and  occupation  PERSONAL FACTORS: Past/current experiences, Profession, and Time since onset of injury/illness/exacerbation are also affecting patient's functional outcome.   REHAB POTENTIAL: Excellent  CLINICAL DECISION MAKING: Stable/uncomplicated  EVALUATION COMPLEXITY: Low   GOALS: Goals reviewed with patient? Yes  SHORT TERM GOALS: Target date: 09/02/2023  Patient will be independent with initial HEP to improve outcomes and carryover.  Baseline:  Goal status: INITIAL  2.  Patient will report 25% improvement in neck and back stiffness/tightness to improve QOL. Baseline: 2-3/10 "tightness" (not "pain") Goal status: INITIAL  LONG TERM GOALS: Target date: 09/30/2023  Patient will be independent with ongoing/advanced HEP for self-management at home.  Baseline:  Goal status: INITIAL  2.  Patient will report 50-75% improvement in neck and back stiffness/tightness to improve QOL.  Baseline: 2-3/10 "tightness" (not "pain") Goal status: INITIAL  3.  Patient will demonstrate improved posture to decrease muscle imbalance. Baseline: Forward head and rounded shoulder posture with increased thoracic kyphosis and B depressed shoulders Goal status: INITIAL  4.  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 Baseline:  Goal status: INITIAL  5.  Patient will demonstrate functional pain free cervical ROM for safety with driving.  Baseline: Refer to above cervical ROM table Goal status: INITIAL  6.  Patient will demonstrate full pain free thoracolumbar ROM w/o stiffness/tightness to perform ADLs.   Baseline: Grossly WFL but flexion and B lateral flexion trigger increase muscle "tightness" Goal status: INITIAL  7.  Patient to improve B shoulder AROM to [facility] without pain provocation or limitation due to stiffness/tightness.   Baseline: Refer to above UE ROM table Goal status: INITIAL   8.  Patient will demonstrate improved functional strength as demonstrated by improved B shoulder and postural/scapular MMT >/= 4+/5. Baseline: Refer to above UE MMT table Goal status: INITIAL  9.  Patient to report ability to perform ADLs, household, and work-related tasks without limitation due to B shoulder or thoracolumbar tightness, LOM or weakness Baseline:  Goal status: INITIAL    PLAN:  PT FREQUENCY: 1-2x/week  PT DURATION: 8 weeks  PLANNED INTERVENTIONS: 97164- PT Re-evaluation, 97110-Therapeutic exercises, 97530- Therapeutic activity, 97112- Neuromuscular re-education, 97535- Self Care, 81191- Manual therapy, 97014- Electrical stimulation (unattended), [id number]- Electrical stimulation (manual), 97035- Ultrasound, Patient/Family education, Taping, Dry Needling, Joint mobilization, Spinal mobilization, Cryotherapy, and Moist heat  PLAN FOR NEXT SESSION: Review initial HEP; progress thoracolumbar flexibility/stretching and postural/UE strengthening; MT +/- TPDN to thoracolumbar paraspinals and periscapular muscles; consider kinesiotaping to promote improved postural awareness and reduce muscle tension   Marry Guan, PT 08/05/2023, 11:30 AM

## 2023-08-05 ENCOUNTER — Encounter: Payer: Self-pay | Admitting: Physical Therapy

## 2023-08-05 ENCOUNTER — Ambulatory Visit: Payer: BC Managed Care – PPO | Attending: Neurological Surgery | Admitting: Physical Therapy

## 2023-08-05 ENCOUNTER — Other Ambulatory Visit: Payer: Self-pay

## 2023-08-05 DIAGNOSIS — R293 Abnormal posture: Secondary | ICD-10-CM | POA: Insufficient documentation

## 2023-08-05 DIAGNOSIS — M5412 Radiculopathy, cervical region: Secondary | ICD-10-CM | POA: Insufficient documentation

## 2023-08-05 DIAGNOSIS — M62830 Muscle spasm of back: Secondary | ICD-10-CM | POA: Diagnosis present

## 2023-08-05 DIAGNOSIS — M6281 Muscle weakness (generalized): Secondary | ICD-10-CM | POA: Insufficient documentation

## 2023-08-05 DIAGNOSIS — M546 Pain in thoracic spine: Secondary | ICD-10-CM | POA: Insufficient documentation

## 2023-08-05 NOTE — Patient Instructions (Signed)

## 2023-08-08 ENCOUNTER — Other Ambulatory Visit: Payer: Self-pay

## 2023-08-08 ENCOUNTER — Encounter (HOSPITAL_BASED_OUTPATIENT_CLINIC_OR_DEPARTMENT_OTHER): Payer: Self-pay | Admitting: Radiology

## 2023-08-08 ENCOUNTER — Emergency Department (HOSPITAL_BASED_OUTPATIENT_CLINIC_OR_DEPARTMENT_OTHER)
Admission: EM | Admit: 2023-08-08 | Discharge: 2023-08-08 | Disposition: A | Discharge status: Home / Self Care | Payer: BC Managed Care – PPO

## 2023-08-08 DIAGNOSIS — G8929 Other chronic pain: Secondary | ICD-10-CM | POA: Diagnosis not present

## 2023-08-08 DIAGNOSIS — X500XXA Overexertion from strenuous movement or load, initial encounter: Secondary | ICD-10-CM | POA: Insufficient documentation

## 2023-08-08 DIAGNOSIS — M545 Low back pain, unspecified: Secondary | ICD-10-CM | POA: Diagnosis present

## 2023-08-08 LAB — COMPREHENSIVE METABOLIC PANEL
ALT: 16 U/L (ref 0–44)
AST: 21 U/L (ref 15–41)
Albumin: 4 g/dL (ref 3.5–5.0)
Alkaline Phosphatase: 49 U/L (ref 38–126)
Anion gap: 7 (ref 5–15)
BUN: 15 mg/dL (ref 6–20)
CO2: 28 mmol/L (ref 22–32)
Calcium: 9.1 mg/dL (ref 8.9–10.3)
Chloride: 107 mmol/L (ref 98–111)
Creatinine, Ser: 1.02 mg/dL (ref 0.61–1.24)
GFR, Estimated: >60 mL/min (ref >60)
Glucose, Bld: 78 mg/dL (ref 70–99)
Potassium: 3.8 mmol/L (ref 3.5–5.1)
Sodium: 142 mmol/L (ref 135–145)
Total Bilirubin: 0.6 mg/dL (ref 0.0–1.2)
Total Protein: 7.1 g/dL (ref 6.5–8.1)

## 2023-08-08 LAB — CBC WITH DIFFERENTIAL/PLATELET
Abs Immature Granulocytes: 0.01 10*3/uL (ref 0.00–0.07)
Basophils Absolute: 0 10*3/uL (ref 0.0–0.1)
Basophils Relative: 1 %
Eosinophils Absolute: 0.3 10*3/uL (ref 0.0–0.5)
Eosinophils Relative: 5 %
HCT: 42.5 % (ref 39.0–52.0)
Hemoglobin: 14.7 g/dL (ref 13.0–17.0)
Immature Granulocytes: 0 %
Lymphocytes Relative: 51 %
Lymphs Abs: 3.1 10*3/uL (ref 0.7–4.0)
MCH: 32.6 pg (ref 26.0–34.0)
MCHC: 34.6 g/dL (ref 30.0–36.0)
MCV: 94.2 fL (ref 80.0–100.0)
Monocytes Absolute: 0.5 10*3/uL (ref 0.1–1.0)
Monocytes Relative: 8 %
Neutro Abs: 2.1 10*3/uL (ref 1.7–7.7)
Neutrophils Relative %: 35 %
Platelets: 216 10*3/uL (ref 150–400)
RBC: 4.51 MIL/uL (ref 4.22–5.81)
RDW: 13.7 % (ref 11.5–15.5)
WBC: 6.1 10*3/uL (ref 4.0–10.5)
nRBC: 0 % (ref 0.0–0.2)

## 2023-08-08 LAB — URINALYSIS, ROUTINE W REFLEX MICROSCOPIC
Bilirubin Urine: NEGATIVE
Glucose, UA: NEGATIVE mg/dL
Hgb urine dipstick: NEGATIVE
Ketones, ur: NEGATIVE mg/dL
Leukocytes,Ua: NEGATIVE
Nitrite: NEGATIVE
Protein, ur: NEGATIVE mg/dL
Specific Gravity, Urine: 1.02 (ref 1.005–1.030)
pH: 7.5 (ref 5.0–8.0)

## 2023-08-08 LAB — LIPASE, BLOOD: Lipase: 34 U/L (ref 11–51)

## 2023-08-08 MED ORDER — LIDOCAINE 5 % EX PTCH: 1.0000 | MEDICATED_PATCH | CUTANEOUS | 0 refills | Status: AC

## 2023-08-08 NOTE — ED Provider Notes (Signed)
[place] [facility] Provider Note   CSN: [id number] Arrival date & time: 08/08/23  1650     History  Chief Complaint  Patient presents with   Back Pain    Timothy Franklin is a 58 y.o. male with history of chronic lower back pain, presents with concern for lower back pain.  States the pain starts in the lower back, worse in the left lower back, and will wrap around into his lower rib cage.  This has been ongoing for years.  No acute worsening.  Denies any injuries to the area.  He works for [employer] and states the pain worsens when lifting heavy objects.  It will also worsen when he is sitting for long periods of time.  Denies any saddle anesthesia, urinary retention, bowel or bladder incontinence, numbness or tingling into the legs, fevers, IV drug use.    Back Pain      Home Medications Prior to Admission medications   Medication Sig Start Date End Date Taking? Authorizing Provider  lidocaine (LIDODERM) 5 % Place 1 patch onto the skin daily. Remove & Discard patch within 12 hours or as directed by MD 08/08/23  Yes Arabella Merles, PA-C  gabapentin (NEURONTIN) 300 MG capsule Take 1 capsule (300 mg total) by mouth 3 (three) times daily. Patient not taking: Reported on 08/05/2023 02/17/22   Myra Rude, MD  ibuprofen (ADVIL) 800 MG tablet TAKE 1 TABLET BY MOUTH TWICE A DAY AS NEEDED 11/18/22   Myra Rude, MD  naproxen (NAPROSYN) 375 MG tablet Take 1 tablet (375 mg total) by mouth 2 (two) times daily. Patient not taking: Reported on 08/05/2023 12/14/21   Marita Kansas, PA-C  tadalafil (CIALIS) 5 MG tablet Take 5 mg by mouth daily as needed.    [provider]      Allergies    No known allergies    Review of Systems   Review of Systems  Musculoskeletal:  Positive for back pain.    Physical Exam Updated Vital Signs BP (!) 129/59 (BP Location: Left Arm)   Pulse 73   Temp 97.7 F (36.5 C)   Resp 18   Ht 5\' 8"  (1.727 m)   Wt  88.5 kg   SpO2 98%   BMI 29.65 kg/m  Physical Exam Vitals and nursing note reviewed.  Constitutional:      Appearance: Normal appearance.  HENT:     Head: Atraumatic.  Cardiovascular:     Rate and Rhythm: Normal rate and regular rhythm.  Pulmonary:     Effort: Pulmonary effort is normal.  Abdominal:     Comments: Abdomen soft nontender to palpation  Musculoskeletal:     Comments: No tenderness palpation of the spine, full spinal range of motion Patient ambulates without difficulty  Mild tenderness palpation of the left lower back musculature.  No tenderness palpation of the right lower back musculature.  Skin:    Comments: No rashes of the back  Neurological:     General: No focal deficit present.     Mental Status: He is alert.     Comments: 5/5 strength of the bilateral lower extremities with hip flexion, knee flexion extension, ankle plantarflexion dorsiflexion  Intact sensation of the bilateral lower extremities  Psychiatric:        Mood and Affect: Mood normal.        Behavior: Behavior normal.     ED Results / Procedures / Treatments   Labs (all labs ordered  are listed, but only abnormal results are displayed) Labs Reviewed  URINALYSIS, ROUTINE W REFLEX MICROSCOPIC  CBC WITH DIFFERENTIAL/PLATELET  LIPASE, BLOOD  COMPREHENSIVE METABOLIC PANEL    EKG None  Radiology No results found.  Procedures Procedures    Medications Ordered in ED Medications - No data to display  ED Course/ Medical Decision Making/ A&P                                 Medical Decision Making Amount and/or Complexity of Data Reviewed Labs: ordered.     Differential diagnosis includes but is not limited to   ED Course:  Patient very well-appearing, stable vital signs.  Presents with concern for his chronic lower back pain that has been ongoing for years.  No acute worsening or change to the character of the pain.  No red flag back symptoms such as saddle anesthesia, bowel  or bladder incontinence, urinary retention, paresthesias, fevers, history of malignancy, or IV drug use.  States he wanted to "make sure" that there is nothing else need to be concerned about that could be causing his pain.  His labs are reassuring today.  I independently reviewed and interpreted CMP, CBC, urinalysis, and lipase which are all within normal limits.  No concern for any abdominal pathology that could be causing his pain.  He is tender along the musculature of the lower back, suspect this is musculoskeletal pain.  No concern for emergent etiologies at this time such as cauda equina or epidural abscess.  No injuries or trauma to the area, no indication for imaging at this time.  He started seeing physical therapy last week, and I encouraged him to continue this course of treatment. Patient declines any pain medication while in the ER  Patient stable and appropriate for discharge home at this time.  Impression: Musculoskeletal lower back pain  Disposition:  The patient was discharged home with instructions to continue with physical therapy to help with his pain.  Tylenol and ibuprofen as needed for pain.  Use prescribed lidocaine patches as needed for pain.  Heating packs on the back to help with pain. Return precautions given.              Final Clinical Impression(s) / ED Diagnoses Final diagnoses:  Chronic left-sided low back pain without sciatica    Rx / DC Orders ED Discharge Orders          Ordered    lidocaine (LIDODERM) 5 %  Every 24 hours        08/08/23 1946              Arabella Merles, PA-C 08/08/23 1947    Coral Spikes, DO 08/08/23 2220

## 2023-08-08 NOTE — Discharge Instructions (Addendum)
Your lab work is reassuring today.  We checked your kidney, liver, and pancreas labs which are normal.  Your electrolytes are normal.  Your blood counts are normal.  Your urine does not show any signs of infection.  Please engage in light physical activity (like walking) to prevent your back pain from worsening and to prevent stiffness. Refrain from bedrest which can make your pain worse.   You may use up to 600mg  ibuprofen every 8 hours as needed for pain.  Do not exceed 2.4g of ibuprofen per day.  Alternatively, you may take up to 1000mg  of tylenol every 6 hours as needed for pain.  Do not take more then 4g per day.  You may use a heating back on your back to help with the pain.   Please continue with physical therapy to help with your back pain.  You have been prescribed lidocaine patches to use as needed for your back pain.  You may apply 1 patch for up to 12 hours at a time, then must remove your patch for a full 12 hours before applying another patch.  Return to the ER if you have loss of bowel or bladder control, you develop fever or chills, you become very dizzy, you have shortness of breath, chest pain, other new or concerning symptoms.

## 2023-08-08 NOTE — ED Triage Notes (Signed)
Pt states he has a bad back and surgery has been recommend but he has not done so yet. Pt works for [employer] and states that he has been having what he thinks are back spasms for a long time now but recently it has been getting more frequent. Pt states movement and eating exacerbates the spams in his back and  left side. Pt states wearing a back brace alleviates the pain but wants to make sure that it isnt a different problem since food is making it worse. Pt states he has been seen for this before.

## 2023-08-16 ENCOUNTER — Ambulatory Visit: Payer: BC Managed Care – PPO | Admitting: Physical Therapy

## 2023-09-05 ENCOUNTER — Ambulatory Visit: Payer: BC Managed Care – PPO | Admitting: Physical Therapy

## 2023-09-06 ENCOUNTER — Ambulatory Visit: Payer: BC Managed Care – PPO | Attending: Neurological Surgery

## 2023-09-06 DIAGNOSIS — R293 Abnormal posture: Secondary | ICD-10-CM | POA: Diagnosis present

## 2023-09-06 DIAGNOSIS — M5412 Radiculopathy, cervical region: Secondary | ICD-10-CM | POA: Insufficient documentation

## 2023-09-06 DIAGNOSIS — M546 Pain in thoracic spine: Secondary | ICD-10-CM | POA: Diagnosis present

## 2023-09-06 DIAGNOSIS — M62830 Muscle spasm of back: Secondary | ICD-10-CM | POA: Insufficient documentation

## 2023-09-06 DIAGNOSIS — M6281 Muscle weakness (generalized): Secondary | ICD-10-CM | POA: Insufficient documentation

## 2023-09-06 NOTE — Therapy (Addendum)
 OUTPATIENT PHYSICAL THERAPY TREATMENT / DISCHARGE SUMMARY   Patient Name: Timothy Franklin MRN: [id number] DOB:[birth year], 58 y.o., male Today's Date: 09/06/2023   END OF SESSION:  PT End of Session - 09/06/23 1537     Visit Number 2    Date for PT Re-Evaluation 09/30/23    Authorization Type [employer]    PT Start Time 1532    PT Stop Time 1616    PT Time Calculation (min) 44 min    Activity Tolerance Patient tolerated treatment well    Behavior During Therapy WFL for tasks assessed/performed              Past Medical History:  Diagnosis Date   Acid reflux disease    Thyroid  nodule    pt reports this was found on him 05/2014   Past Surgical History:  Procedure Laterality Date   CIRCUMCISION     ESOPHAGOGASTRODUODENOSCOPY     Patient Active Problem List   Diagnosis Date Noted   Neuropathic pain 01/05/2022   Strain of lumbar region 04/22/2021    PCP:  No Pcp  REFERRING PROVIDER: Dawley, Lani BROCKS, DO   REFERRING DIAG: M48.02 (ICD-10-CM) - Spinal stenosis in cervical region  Also for treatment of left thoracic spasms  THERAPY DIAG:  Radiculopathy, cervical region  Pain in thoracic spine  Muscle spasm of back  Abnormal posture  Muscle weakness (generalized)  RATIONALE FOR EVALUATION AND TREATMENT: Rehabilitation  ONSET DATE: ~3 yrs  NEXT MD VISIT:  PRN   SUBJECTIVE:                                                                                                                                                                                                         SUBJECTIVE STATEMENT: Pt reports that he has tightness not pain, he reports he is doing   PAIN: Are you having pain? Yes: NPRS scale: 2-3/10 tightness (not pain)  Pain location: L>R thoracolumbar spine wrapping around ribs on L Pain description: tight, stiff Aggravating factors: pulling in/out truck, bending and lifting, jumping (when happy at church), prolonged  sitting Relieving factors: ibuprofen   PERTINENT HISTORY:  Cervical stenosis with mild myelopathy, neuropathic pain, acid reflux  PRECAUTIONS: None  HAND DOMINANCE: Right  RED FLAGS: None  WEIGHT BEARING RESTRICTIONS: No  FALLS:  Has patient fallen in last 6 months? 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   OBJECTIVE:   DIAGNOSTIC FINDINGS:  05/26/21 - MRI spine cervical: IMPRESSION:   Cervical spondylosis with multilevel severe spinal stenosis including C3-4, C4-5, C5-6 and C6-7, with increased signal in the cord at C3-4 and C4-5.   There is multilevel neuroforaminal stenosis.   PATIENT SURVEYS:  NDI 2 / 50 = 4.0 % - Patient reporting activities not limited by pain normal, only muscle tightness  SCREENING FOR RED FLAGS: Bowel or bladder incontinence: No Spinal tumors: No Cauda equina syndrome: No Compression fracture: No Abdominal aneurysm: No  COGNITION: Overall cognitive status: Within functional limits for tasks assessed     SENSATION: WFL  POSTURE:  rounded shoulders, forward head, increased thoracic kyphosis, and flexed trunk   PALPATION: Increased muscle tension throughout L>R cervical and thoracolumbar paraspinals, periscapular muscles and QL  CERVICAL ROM:   Active ROM Eval  Flexion 62  Extension 34  Right lateral flexion WNL  Left lateral flexion WNL  Right rotation WNL  Left rotation WNL    (Blank rows = not tested)  UPPER EXTREMITY ROM:  Active ROM Right eval Left eval  Shoulder flexion 89 116  Shoulder extension 45 49  Shoulder abduction 115 125  Shoulder adduction    Shoulder internal rotation FIR L3 FIR L2  Shoulder  external rotation FER top of head FER top of head  Elbow flexion    Elbow extension    Wrist flexion    Wrist extension    Wrist ulnar deviation    Wrist radial deviation    Wrist pronation    Wrist supination      (Blank rows = not tested)  UPPER EXTREMITY MMT:  MMT Right* eval Left*  eval  Shoulder flexion 4- 4  Shoulder extension 4+ 4+  Shoulder abduction 4- 4  Shoulder adduction    Shoulder internal rotation 5 5  Shoulder external rotation 4- 4-  Middle trapezius 4 4  Lower trapezius 3- 3-  Elbow flexion    Elbow extension    Wrist flexion    Wrist extension    Wrist ulnar deviation    Wrist radial deviation    Wrist pronation    Wrist supination    Grip strength    (Blank rows = not tested, * - for available ROM)  THORACOLUMBAR ROM:  Grossly WFL but flexion and B lateral flexion trigger increase muscle tightness  MUSCLE LENGTH: Hamstrings: mild tight L>R ITB: WFL Piriformis: mild/mod tight B Hip flexors: mild tight B Quads: mild/mod tight B Heelcord: NT  LOWER EXTREMITY ROM:    Grossly WFL other than mild restriction in hip IR/ER  LOWER EXTREMITY MMT:    MMT Right eval Left eval  Hip flexion 5 5  Hip extension 4+ 4+  Hip abduction 4+ 4  Hip adduction 5 5  Hip internal rotation 5 4  Hip external rotation 5 5  Knee flexion 5 5  Knee extension 5 5  Ankle dorsiflexion 5 5  Ankle plantarflexion    Ankle inversion    Ankle eversion      (Blank rows = not tested)   TODAY'S TREATMENT:  09/06/2023  THERAPEUTIC EXERCISE: to improve flexibility, strength and mobility.  Demonstration, verbal and tactile cues throughout for technique. UBE L1.0 x 6 min  NEUROMUSCULAR RE-EDUCATION: To improve posture, balance, and kinesthesia. Lat pulls 25# x 15 Seated rows 35# x 15 high grips  Supine ab brace 5x Supine LE raise and lower x 5 bil Supine bridge 10x5  08/05/2023 - Eval THERAPEUTIC EXERCISE: to improve flexibility, strength and mobility.  Demonstration,  verbal and tactile cues throughout for technique.  3-way doorway pec stretch (60/90/120) x 30 each Standing RTB scap retraction + B shoulder ER 10 x 3 with back along door frame to promote upright posture and scapular engagement Standing RTB/GTB scap retraction + B shoulder horizontal ABD 10 x 3 with back along door frame to promote upright posture and scapular engagement Standing GTB scap retraction + B shoulder horizontal ABD alternating diagonals 10 x 3 with back along door frame to promote upright posture and scapular engagement   PATIENT EDUCATION:  Education details: PT eval findings, anticipated POC, initial HEP, postural awareness, and role of DN  Person educated: Patient Education method: Explanation, Demonstration, Verbal cues, Handouts, and information provided for MedBridgeGO app access Education comprehension: verbalized understanding, returned demonstration, verbal cues required, and needs further education  HOME EXERCISE PROGRAM: Access Code: [email or web address] URL: [email or web address] Date: 09/06/2023 Prepared by: Jinelle Butchko  Exercises - Doorway Pec Stretch at 90 Degrees Abduction  - 2-3 x daily - 7 x weekly - 3 reps - 30 sec hold - Doorway Pec Stretch at 60 Elevation  - 2-3 x daily - 7 x weekly - 3 reps - 30 sec hold - Doorway Pec Stretch at 120 Degrees Abduction  - 2-3 x daily - 7 x weekly - 3 reps - 30 sec hold - Shoulder External Rotation and Scapular Retraction with Resistance  - 1 x daily - 7 x weekly - 2 sets - 10 reps - 3-5 sec hold - Standing Shoulder Horizontal Abduction with Resistance  - 1 x daily - 7 x weekly - 2 sets - 10 reps - 3 sec hold - Standing Shoulder Diagonal Horizontal Abduction 60/120 Degrees with Resistance  - 1 x daily - 7 x weekly - 2 sets - 10 reps - 3 sec hold - Supine Transversus Abdominis Bracing - Hands on Stomach  - 1 x daily - 7 x weekly - 2 sets - 10 reps - Supine 90/90 Alternating Toe Touch  - 1 x daily - 7 x weekly - 2  sets - 10 reps - Supine Bridge  - 1 x daily - 7 x weekly - 2 sets - 10 reps - 5 sec hold  Patient Education - Trigger Point Dry Needling   ASSESSMENT:  CLINICAL IMPRESSION: Pt returns today after ~ 1 month of absence d/t cancellations. Progressed with postural strengthening and core stabilization for muscle retraining. Cues required to avoid lifting his neck with abd bracing. Good tolerance for interventions with no increased pain. Progressing toward STG #2 and met STG #1. Timothy Franklin will benefit from skilled PT to address above deficits to improve mobility and activity tolerance with decreased pain interference.  OBJECTIVE IMPAIRMENTS: decreased activity tolerance, decreased knowledge of condition, decreased ROM, decreased strength, increased fascial restrictions, impaired perceived functional ability, increased muscle spasms, impaired flexibility, impaired UE functional use, improper body mechanics, postural dysfunction, and pain.   ACTIVITY LIMITATIONS: carrying, lifting, bending, sitting, reach over head, and caring for others  PARTICIPATION LIMITATIONS: cleaning, laundry, interpersonal relationship, driving, and occupation  PERSONAL FACTORS: Past/current experiences, Profession, and Time since onset of injury/illness/exacerbation are also affecting patient's functional outcome.   REHAB POTENTIAL: Excellent  CLINICAL DECISION MAKING: Stable/uncomplicated  EVALUATION COMPLEXITY: Low   GOALS: Goals reviewed with patient? Yes  SHORT TERM GOALS: Target date: 09/02/2023  Patient will be independent with initial HEP to improve outcomes and carryover.  Baseline:  Goal status: MET- 09/06/23  2.  Patient will report 25% improvement in neck and back stiffness/tightness to improve QOL. Baseline: 2-3/10 tightness (not pain) Goal status: IN PROGRESS- 09/06/23 5% improvement  LONG TERM GOALS: Target date: 09/30/2023  Patient will be independent with ongoing/advanced HEP for  self-management at home.  Baseline:  Goal status: IN PROGRESS  2.  Patient will report 50-75% improvement in neck and back stiffness/tightness to improve QOL.  Baseline: 2-3/10 tightness (not pain) Goal status: IN PROGRESS  3.  Patient will demonstrate improved posture to decrease muscle imbalance. Baseline: Forward head and rounded shoulder posture with increased thoracic kyphosis and B depressed shoulders Goal status: IN PROGRESS  4.  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 Baseline:  Goal status: IN PROGRESS  5.  Patient will demonstrate functional pain free cervical ROM for safety with driving.  Baseline: Refer to above cervical ROM table Goal status: IN PROGRESS  6.  Patient will demonstrate full pain free thoracolumbar ROM w/o stiffness/tightness to perform ADLs.   Baseline: Grossly WFL but flexion and B lateral flexion trigger increase muscle tightness Goal status: IN PROGRESS  7.  Patient to improve B shoulder AROM to [facility] without pain provocation or limitation due to stiffness/tightness.  Baseline: Refer to above UE ROM table Goal status: IN PROGRESS   8.  Patient will demonstrate improved functional strength as demonstrated by improved B shoulder and postural/scapular MMT >/= 4+/5. Baseline: Refer to above UE MMT table Goal status: IN PROGRESS  9.  Patient to report ability to perform ADLs, household, and work-related tasks without limitation due to B shoulder or thoracolumbar tightness, LOM or weakness Baseline:  Goal status: IN PROGRESS    PLAN:  PT FREQUENCY: 1-2x/week  PT DURATION: 8 weeks  PLANNED INTERVENTIONS: 97164- PT Re-evaluation, 97110-Therapeutic exercises, 97530- Therapeutic activity, 97112- Neuromuscular re-education, 97535- Self Care, [id number]- Manual therapy, 97014-  Electrical stimulation (unattended), [id number]- Electrical stimulation (manual), 97035- Ultrasound, Patient/Family education, Taping, Dry Needling, Joint mobilization, Spinal mobilization, Cryotherapy, and Moist heat  PLAN FOR NEXT SESSION: progress thoracolumbar flexibility/stretching and postural/UE strengthening; MT +/- TPDN to thoracolumbar paraspinals and periscapular muscles; consider kinesiotaping to promote improved postural awareness and reduce muscle tension   Tomeeka Plaugher L Alzena Gerber, PTA 09/06/2023, 4:20 PM   PHYSICAL THERAPY DISCHARGE SUMMARY  Visits from Start of Care: 2  Current functional level related to goals / functional outcomes: Refer to above clinical impression and goal assessment for status as of last visit on 09/06/2023. Patient returned to PT for only 1 treatment visit, canceling 2 visits and no showing for last 2 visits.  He has not returned to PT in >30 days, therefore will proceed with discharge from PT for this episode.     Remaining deficits: As above. Unable to formally assess status at discharge due to failure to return to PT.    Education / Equipment: HEP  Patient agrees to discharge. Patient goals were not met. Patient is being discharged due to not returning since the last visit.  Elijah EMERSON Hidden, PT 01/10/2024, 10:07 AM  [facility] [address]  [address] [place], [place], [address] Phone: [phone]   Fax:  [phone]

## 2023-09-20 ENCOUNTER — Ambulatory Visit: Payer: BC Managed Care – PPO | Attending: Neurological Surgery | Admitting: Physical Therapy

## 2023-09-27 ENCOUNTER — Ambulatory Visit: Payer: BC Managed Care – PPO | Admitting: Physical Therapy

## 2023-10-16 ENCOUNTER — Encounter (HOSPITAL_BASED_OUTPATIENT_CLINIC_OR_DEPARTMENT_OTHER): Payer: Self-pay

## 2023-10-16 ENCOUNTER — Emergency Department (HOSPITAL_BASED_OUTPATIENT_CLINIC_OR_DEPARTMENT_OTHER)
Admission: EM | Admit: 2023-10-16 | Discharge: 2023-10-16 | Disposition: A | Discharge status: Home / Self Care | Attending: Emergency Medicine | Admitting: Emergency Medicine

## 2023-10-16 ENCOUNTER — Other Ambulatory Visit: Payer: Self-pay

## 2023-10-16 DIAGNOSIS — M62830 Muscle spasm of back: Secondary | ICD-10-CM | POA: Insufficient documentation

## 2023-10-16 DIAGNOSIS — M62838 Other muscle spasm: Secondary | ICD-10-CM

## 2023-10-16 DIAGNOSIS — M542 Cervicalgia: Secondary | ICD-10-CM | POA: Diagnosis present

## 2023-10-16 MED ORDER — ACETAMINOPHEN 500 MG PO TABS
1000.0000 mg | ORAL_TABLET | Freq: Once | ORAL | Status: AC
  Administered 2023-10-16: 1000 mg via ORAL
  Filled 2023-10-16: qty 2

## 2023-10-16 MED ORDER — CYCLOBENZAPRINE HCL 10 MG PO TABS: 10.0000 mg | ORAL_TABLET | Freq: Two times a day (BID) | ORAL | 0 refills | Status: AC | PRN

## 2023-10-16 MED ORDER — IBUPROFEN 800 MG PO TABS
800.0000 mg | ORAL_TABLET | Freq: Once | ORAL | Status: AC
  Administered 2023-10-16: 800 mg via ORAL
  Filled 2023-10-16: qty 1

## 2023-10-16 MED ORDER — CYCLOBENZAPRINE HCL 10 MG PO TABS
10.0000 mg | ORAL_TABLET | Freq: Once | ORAL | Status: AC
  Administered 2023-10-16: 10 mg via ORAL
  Filled 2023-10-16: qty 1

## 2023-10-16 NOTE — ED Triage Notes (Signed)
 Reports R sided neck/shoulder pain since Tuesday. 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

## 2023-10-16 NOTE — ED Provider Notes (Signed)
 [facility] Provider Note   CSN: [id number] Arrival date & time: 10/16/23  4098     History  Chief Complaint  Patient presents with   Neck Pain    Timothy Franklin is a 58 y.o. male.  With a history of back pain who presents to the ED for upper back pain.  Patient first experienced pain in his right posterior shoulder region 5 days ago that has persisted since the onset.  The pain seemed to begin after doing weighted shoulder shrugs at the gym.  Now has difficulty turning his head to the right.  Feels as though there is a muscle spasm throughout the trapezius and neck region.  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  No inciting injury or specific trauma to this region.  Denies headaches fevers chills photophobia.   Neck Pain      Home Medications Prior to Admission medications   Medication Sig Start Date End Date Taking? Authorizing Provider  cyclobenzaprine (FLEXERIL) 10 MG tablet Take 1 tablet (10 mg total) by mouth 2 (two) times daily as needed for up to 4 days for muscle spasms. 10/16/23 10/20/23 Yes Royanne Foots, DO  gabapentin (NEURONTIN) 300 MG capsule Take 1 capsule (300 mg total) by mouth 3 (three) times daily. Patient not taking: Reported on 08/05/2023 02/17/22   Myra Rude, MD  ibuprofen (ADVIL) 800 MG tablet TAKE 1 TABLET BY MOUTH TWICE A DAY AS NEEDED 11/18/22   Myra Rude, MD  lidocaine (LIDODERM) 5 % Place 1 patch onto the skin daily. Remove & Discard patch within 12 hours or as directed by MD 08/08/23   Arabella Merles, PA-C  naproxen (NAPROSYN) 375 MG tablet Take 1 tablet (375 mg total) by mouth 2 (two) times daily. Patient not taking: Reported on 08/05/2023 12/14/21   Marita Kansas, PA-C  tadalafil (CIALIS) 5 MG tablet Take 5 mg by mouth daily as needed.    [provider]      Allergies    No known allergies    Review of Systems   Review of Systems  Musculoskeletal:  Positive for neck pain.     Physical Exam Updated Vital Signs BP 118/84   Pulse (!) 56   Temp 97.6 F (36.4 C) (Oral)   Resp 17   Ht 5\' 8"  (1.727 m)   Wt 88.5 kg   SpO2 98%   BMI 29.65 kg/m  Physical Exam Vitals and nursing note reviewed.  HENT:     Head: Normocephalic and atraumatic.  Eyes:     Pupils: Pupils are equal, round, and reactive to light.  Neck:     Comments: Hypertonicity of right trapezius with right paraspinal cervical tenderness No midline tenderness step-off deformity No rigidity meningismus Cardiovascular:     Rate and Rhythm: Normal rate and regular rhythm.  Pulmonary:     Effort: Pulmonary effort is normal.     Breath sounds: Normal breath sounds.  Abdominal:     Palpations: Abdomen is soft.     Tenderness: There is no abdominal tenderness.  Skin:    General: Skin is warm and dry.  Neurological:     Mental Status: He is alert.  Psychiatric:        Mood and Affect: Mood normal.     ED Results / Procedures / Treatments   Labs (all labs ordered are listed, but only abnormal results are displayed) Labs Reviewed - No data to display  EKG None  Radiology No results found.  Procedures Procedures    Medications Ordered in ED Medications  acetaminophen (TYLENOL) tablet 1,000 mg (1,000 mg Oral Given 10/16/23 0909)  ibuprofen (ADVIL) tablet 800 mg (800 mg Oral Given 10/16/23 0909)  cyclobenzaprine (FLEXERIL) tablet 10 mg (10 mg Oral Given 10/16/23 1610)    ED Course/ Medical Decision Making/ A&P Clinical Course as of 10/16/23 0951  Sun Oct 16, 2023  0950 Reevaluated patient.  He reports improvement in his discomfort with better range of motion with rotation to the right.  Will discharge with short course of Flexeril for muscle spasm [MP]    Clinical Course User Index [MP] Royanne Foots, DO                                 Medical Decision Making 58 year old male with history as above presenting for likely strain of trapezius.  History and physical consistent  with muscle spasm.  Likely strain from shoulder shrug exercises at the gym with 225 pounds.  Has not been taking any medications at home for pain.  Will provide him with ibuprofen, acetaminophen and Flexeril here to help with the muscle spasm.  He works as a Civil Service fast streamer for [employer].  Will provide him with a work note to keep him out of work for the next few days in order for spasm to improve prevent further exacerbation of his injury.  Risk OTC drugs. Prescription drug management.           Final Clinical Impression(s) / ED Diagnoses Final diagnoses:  Trapezius muscle spasm    Rx / DC Orders ED Discharge Orders          Ordered    cyclobenzaprine (FLEXERIL) 10 MG tablet  2 times daily PRN        10/16/23 0910              Royanne Foots, DO 10/16/23 [phone]

## 2023-10-16 NOTE — Discharge Instructions (Addendum)
 He was seen in the emergency room for neck and upper back pain This is most likely a spasm of your trapezius For this reason we gave you Tylenol Motrin and medication called Flexeril which is a muscle relaxer We have called in a few more days of Flexeril to your pharmacy at [facility] Take this medication as needed for muscle spasm pain Do not drink alcohol or drive while taking Flexeril as this medication can make you drowsy Continue light stretching to help with the spasm Take Tylenol or Motrin as directed for mild to moderate pain Return to the Emergency Department for severe neck pain or any other concerns Otherwise please follow-up with your primary care doctor in 1 week for reevaluation

## 2023-11-29 ENCOUNTER — Other Ambulatory Visit: Payer: Self-pay

## 2023-11-29 ENCOUNTER — Encounter (HOSPITAL_BASED_OUTPATIENT_CLINIC_OR_DEPARTMENT_OTHER): Payer: Self-pay | Admitting: Urology

## 2023-11-29 ENCOUNTER — Emergency Department (HOSPITAL_BASED_OUTPATIENT_CLINIC_OR_DEPARTMENT_OTHER)
Admission: EM | Admit: 2023-11-29 | Discharge: 2023-11-29 | Disposition: A | Discharge status: Home / Self Care | Attending: Emergency Medicine | Admitting: Emergency Medicine

## 2023-11-29 ENCOUNTER — Emergency Department (HOSPITAL_BASED_OUTPATIENT_CLINIC_OR_DEPARTMENT_OTHER)

## 2023-11-29 DIAGNOSIS — M62838 Other muscle spasm: Secondary | ICD-10-CM

## 2023-11-29 DIAGNOSIS — M51369 Other intervertebral disc degeneration, lumbar region without mention of lumbar back pain or lower extremity pain: Secondary | ICD-10-CM | POA: Diagnosis not present

## 2023-11-29 DIAGNOSIS — R001 Bradycardia, unspecified: Secondary | ICD-10-CM | POA: Diagnosis not present

## 2023-11-29 DIAGNOSIS — M62830 Muscle spasm of back: Secondary | ICD-10-CM | POA: Diagnosis present

## 2023-11-29 DIAGNOSIS — M5134 Other intervertebral disc degeneration, thoracic region: Secondary | ICD-10-CM

## 2023-11-29 MED ORDER — CYCLOBENZAPRINE HCL 10 MG PO TABS
10.0000 mg | ORAL_TABLET | Freq: Two times a day (BID) | ORAL | 0 refills | Status: AC | PRN
Start: 2023-11-29 — End: ?

## 2023-11-29 MED ORDER — IBUPROFEN 800 MG PO TABS
800.0000 mg | ORAL_TABLET | Freq: Once | ORAL | Status: AC
  Administered 2023-11-29: 800 mg via ORAL
  Filled 2023-11-29: qty 1

## 2023-11-29 NOTE — Discharge Instructions (Addendum)
 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  It is also known by MRI imaging from a few years ago that you also have this in your cervical spine.  Usual standard of care is conservative management, recommend NSAIDs, lidocaine  patch, muscle relaxants as needed, outpatient follow-up with the primary care provider.  Flexeril  has been prescribed which is a muscle relaxant, do not operate heavy machinery while taking muscle relaxants.  Further follow-up outpatient with a spine specialist could be indicated if you continue to have ongoing problems, return for any falls or trauma, new onset weakness or numbness in the lower extremities, urinary or fecal incontinence or numbness in the area of your groin where you would sit on a saddle.

## 2023-11-29 NOTE — ED Provider Notes (Signed)
 [place] [facility] Provider Note   CSN: [id number] Arrival date & time: 11/29/23  9147     History  Chief Complaint  Patient presents with   Spasms    Timothy Franklin is a 58 y.o. male.  HPI   58 year old male presenting to the emergency department with roughly 1 year of muscle spasms.  The patient has a history of degenerative disc disease confirmed in the cervical spine on MRI imaging never 2022, has had thoracic spine x-ray imaging in August 2011 which showed mild S-shaped thoracic scoliosis, presenting to the emergency department with muscle spasms.  The patient has daily spasms, triggered by twisting and engagement of his muscles when exercising.  He endorses spasm and and tightness in the left lumbar region radiating to his abdomen and thoracic left-sided tightening and spasming radiating to his left chest wall.  He denies any recent falls or trauma, no active pain for the past year.  He states that his spasms improves when he takes ibuprofen , has not tried a muscle relaxant, has had no neurologic deficits, states the lidocaine  patches do not help.  He denies any pain today, states "I am having a muscle spasm right now" after sitting up for an exam and engaging his abdominal and back musculature.  Home Medications Prior to Admission medications   Medication Sig Start Date End Date Taking? Authorizing Provider  cyclobenzaprine  (FLEXERIL ) 10 MG tablet Take 1 tablet (10 mg total) by mouth 2 (two) times daily as needed for muscle spasms. 11/29/23  Yes Rosealee Concha, MD  gabapentin  (NEURONTIN ) 300 MG capsule Take 1 capsule (300 mg total) by mouth 3 (three) times daily. Patient not taking: Reported on 08/05/2023 02/17/22   Margaree Shark, MD  ibuprofen  (ADVIL ) 800 MG tablet TAKE 1 TABLET BY MOUTH TWICE A DAY AS NEEDED 11/18/22   Margaree Shark, MD  lidocaine  (LIDODERM ) 5 % Place 1 patch onto the skin daily. Remove & Discard patch within 12 hours or  as directed by MD 08/08/23   Rexie Catena, PA-C  naproxen  (NAPROSYN ) 375 MG tablet Take 1 tablet (375 mg total) by mouth 2 (two) times daily. Patient not taking: Reported on 08/05/2023 12/14/21   Lucina Sabal, PA-C  tadalafil (CIALIS) 5 MG tablet Take 5 mg by mouth daily as needed.    [provider]      Allergies    No known allergies    Review of Systems   Review of Systems  All other systems reviewed and are negative.   Physical Exam Updated Vital Signs BP (!) 145/75 (BP Location: Right Arm)   Pulse (!) 55   Temp 98.4 F (36.9 C)   Resp 20   Ht 5\' 8"  (1.727 m)   Wt 88.5 kg   SpO2 99%   BMI 29.67 kg/m  Physical Exam Vitals and nursing note reviewed.  Constitutional:      General: He is not in acute distress.    Appearance: He is well-developed.  HENT:     Head: Normocephalic and atraumatic.  Eyes:     Conjunctiva/sclera: Conjunctivae normal.  Cardiovascular:     Rate and Rhythm: Normal rate and regular rhythm.  Pulmonary:     Effort: Pulmonary effort is normal. No respiratory distress.     Breath sounds: Normal breath sounds.     Comments: Lungs CTAB Chest:     Comments: No rib TTP Abdominal:     Palpations: Abdomen is soft.  Tenderness: There is no abdominal tenderness.     Comments: Abdomen is soft, nontender, nondistended, no rebound or guarding  Musculoskeletal:        General: No swelling or tenderness.     Cervical back: Neck supple.     Comments: No midline TTP of the spine  Skin:    General: Skin is warm and dry.     Capillary Refill: Capillary refill takes less than 2 seconds.  Neurological:     Mental Status: He is alert.     Sensory: No sensory deficit.     Motor: No weakness.     Comments: 5 out of 5 strength in the bilateral upper and lower extremities with intact sensation to light touch in all 4 extremities  Psychiatric:        Mood and Affect: Mood normal.     ED Results / Procedures / Treatments   Labs (all labs ordered  are listed, but only abnormal results are displayed) Labs Reviewed - No data to display  EKG None  Radiology DG Lumbar Spine 2-3 Views Result Date: 11/29/2023 CLINICAL DATA:  Lower back pain for 2 weeks without known injury. EXAM: LUMBAR SPINE - 2-3 VIEW COMPARISON:  None Available. FINDINGS: Minimal grade 1 retrolisthesis of L3-4 is noted secondary to mild degenerative disc disease at this level. Mild degenerative disc disease is also noted at L5-S1. No fracture is noted. IMPRESSION: Multilevel degenerative changes as noted above. No acute abnormality seen. Electronically Signed   By: Rosalene Colon M.D.   On: 11/29/2023 11:22   DG Thoracic Spine 2 View Result Date: 11/29/2023 CLINICAL DATA:  Thoracic spine pain for 2 weeks without known injury. EXAM: THORACIC SPINE 2 VIEWS COMPARISON:  None Available. FINDINGS: There is no evidence of thoracic spine fracture. Alignment is normal. Mild to moderate disc space narrowing is noted at multiple levels in the midthoracic spine. IMPRESSION: Multilevel degenerative changes as noted above. No acute abnormality seen. Electronically Signed   By: Rosalene Colon M.D.   On: 11/29/2023 11:20   DG Ribs Unilateral W/Chest Left Result Date: 11/29/2023 CLINICAL DATA:  Left rib pain for 2 weeks without known injury. EXAM: LEFT RIBS AND CHEST - 3+ VIEW COMPARISON:  September 18, 2021. FINDINGS: No fracture or other bone lesions are seen involving the ribs. There is no evidence of pneumothorax or pleural effusion. Both lungs are clear. Heart size and mediastinal contours are within normal limits. IMPRESSION: Negative. Electronically Signed   By: Rosalene Colon M.D.   On: 11/29/2023 11:19    Procedures Procedures    Medications Ordered in ED Medications  ibuprofen  (ADVIL ) tablet 800 mg (800 mg Oral Given 11/29/23 1049)    ED Course/ Medical Decision Making/ A&P                                 Medical Decision Making Amount and/or Complexity of Data  Reviewed Radiology: ordered.  Risk Prescription drug management.   58 year old male presenting to the emergency department with roughly 1 year of muscle spasms.  The patient has a history of degenerative disc disease confirmed in the cervical spine on MRI imaging never 2022, has had thoracic spine x-ray imaging in August 2011 which showed mild S-shaped thoracic scoliosis, presenting to the emergency department with muscle spasms.  The patient has daily spasms, triggered by twisting and engagement of his muscles when exercising.  He endorses spasm  and and tightness in the left lumbar region radiating to his abdomen and thoracic left-sided tightening and spasming radiating to his left chest wall.  He denies any recent falls or trauma, no active pain for the past year.  He states that his spasms improves when he takes ibuprofen , has not tried a muscle relaxant, has had no neurologic deficits, states the lidocaine  patches do not help.  He denies any pain today, states "I am having a muscle spasm right now" after sitting up for an exam and engaging his abdominal and back musculature.  On arrival, the patient was afebrile, mildly bradycardic heart rate 55, not tachypneic RR 20, BP 145/75, saturating 99% on room air.  The patient presenting with roughly 1 year of muscle spasms, has not tried a prescription muscle relaxant.  Describes what sounds to be pain from degenerative disc disease and associated muscle spasm of the back radiating around to the chest and abdomen.  No active pain or discomfort at this time.  No midline tenderness, no neurologic deficit.  No recent falls or trauma.  Has no red flag symptoms, no recent falls or trauma, no fevers, no saddle anesthesia, no numbness or weakness in the lower extremities.  His neurologic exam is normal and he has negative straight leg raise test bilaterally.  X-ray imaging of the chest and ribs on the left was negative.  X-ray imaging of the thoracic and lumbar  spine revealed extensive degenerative disc disease throughout the spine.  Suspect likely degenerative disc disease resulting in radiculopathy and muscle spasms.  Patient was provided a prescription for Flexeril , advised continued symptomatic management outpatient, outpatient follow-up with his PCP recommended with consideration for outpatient referral to a spine specialist.  Overall stable for discharge at this time, is answered at time of discharge.  Final Clinical Impression(s) / ED Diagnoses Final diagnoses:  Degeneration of intervertebral disc of lumbar region without discogenic back pain or lower extremity pain  DDD (degenerative disc disease), thoracic  Muscle spasm of back  Muscle spasm    Rx / DC Orders ED Discharge Orders          Ordered    cyclobenzaprine  (FLEXERIL ) 10 MG tablet  2 times daily PRN        11/29/23 1127              Rosealee Concha, MD 11/29/23 1130

## 2023-11-29 NOTE — ED Triage Notes (Signed)
 Pt states tightness and spasms in left side rib cage and stomach  States started 2 weeks ago  Denies any known injury to ribcage  States wants xray of side and stomach  Works a physical job and states worse with work and working out

## 2023-11-29 NOTE — ED Notes (Signed)
 Patient transported to X-ray

## 2024-02-28 IMAGING — DX DG CHEST 2V
2 series · 2 of 2 positions shown · non-contrast
Comparison: Chest x-ray with ribs 05/08/2021

CLINICAL DATA: Chest and abdominal pain.

EXAM:
CHEST - 2 VIEW

[chest pa]
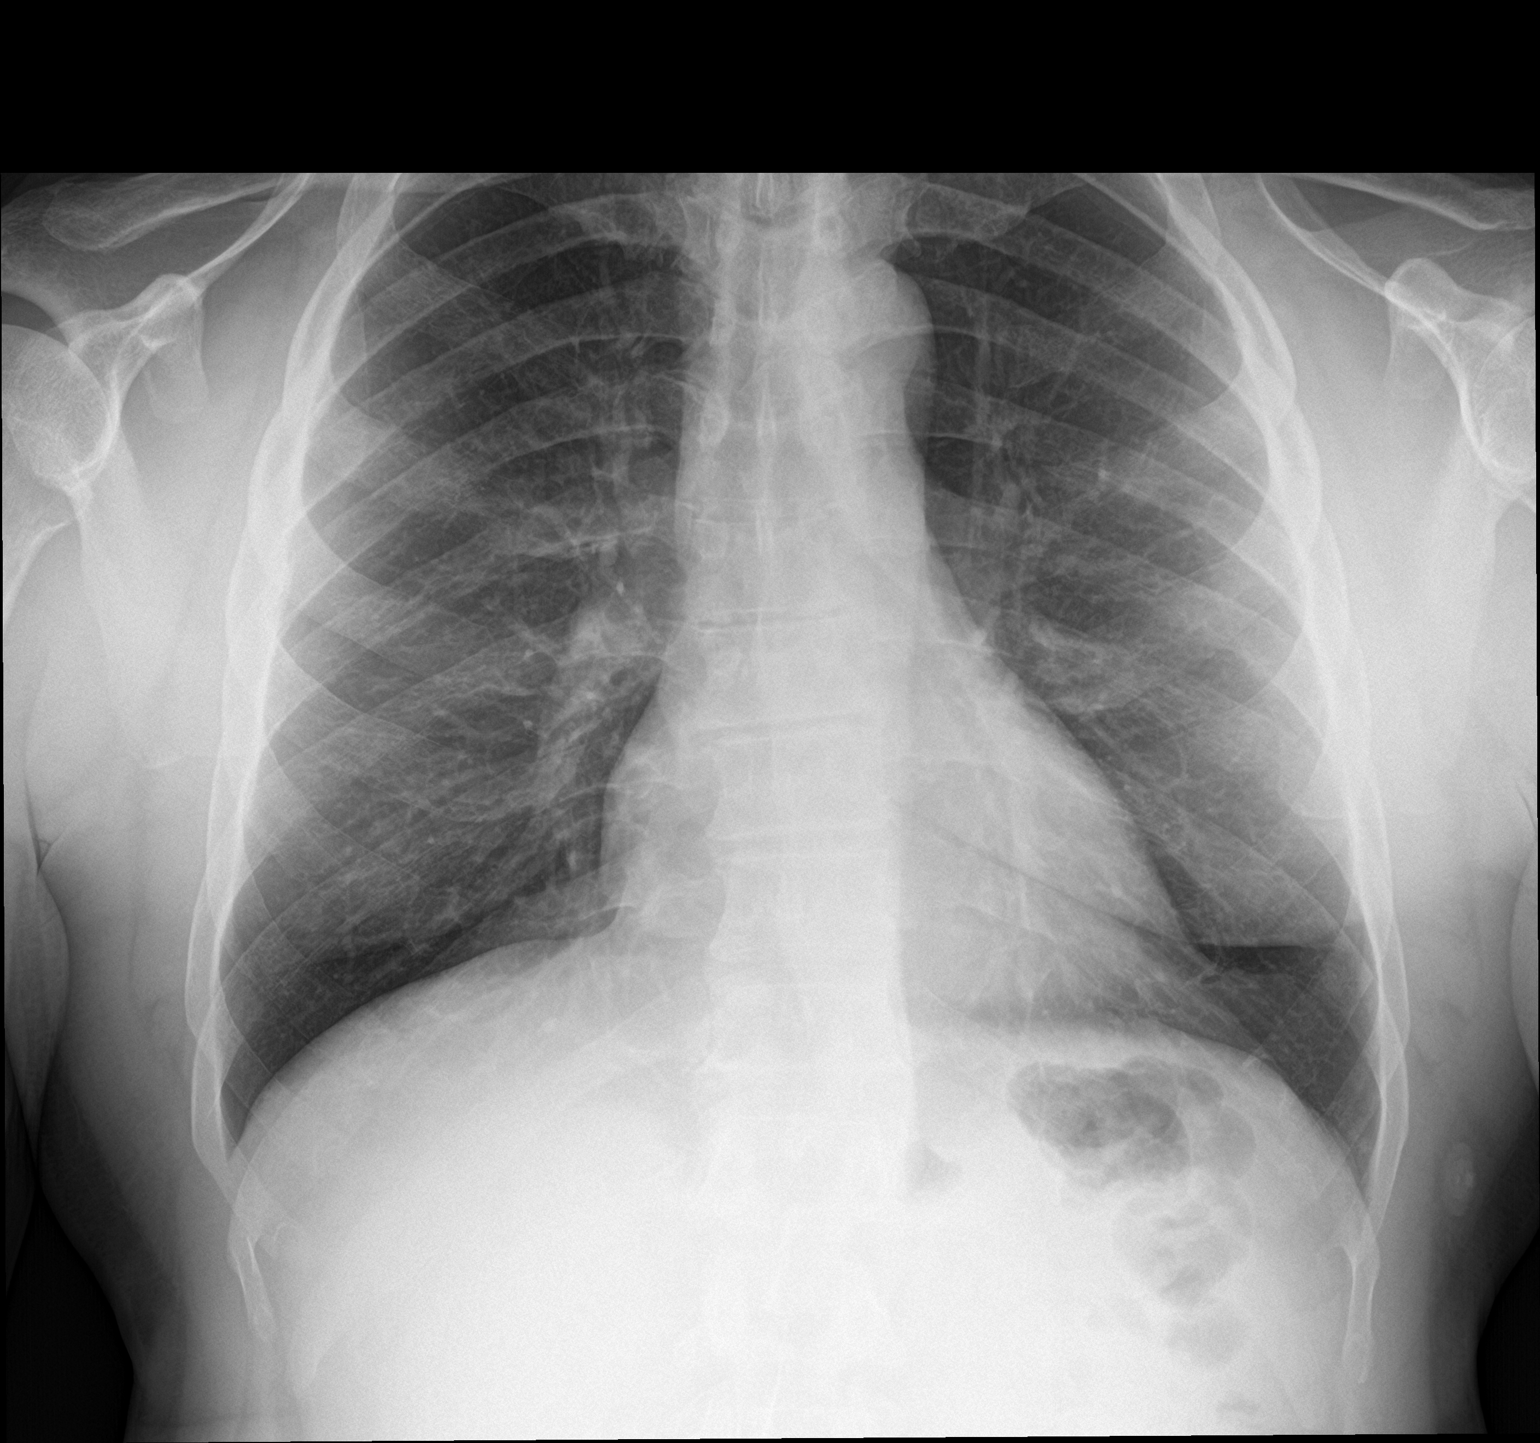

[chest lat]
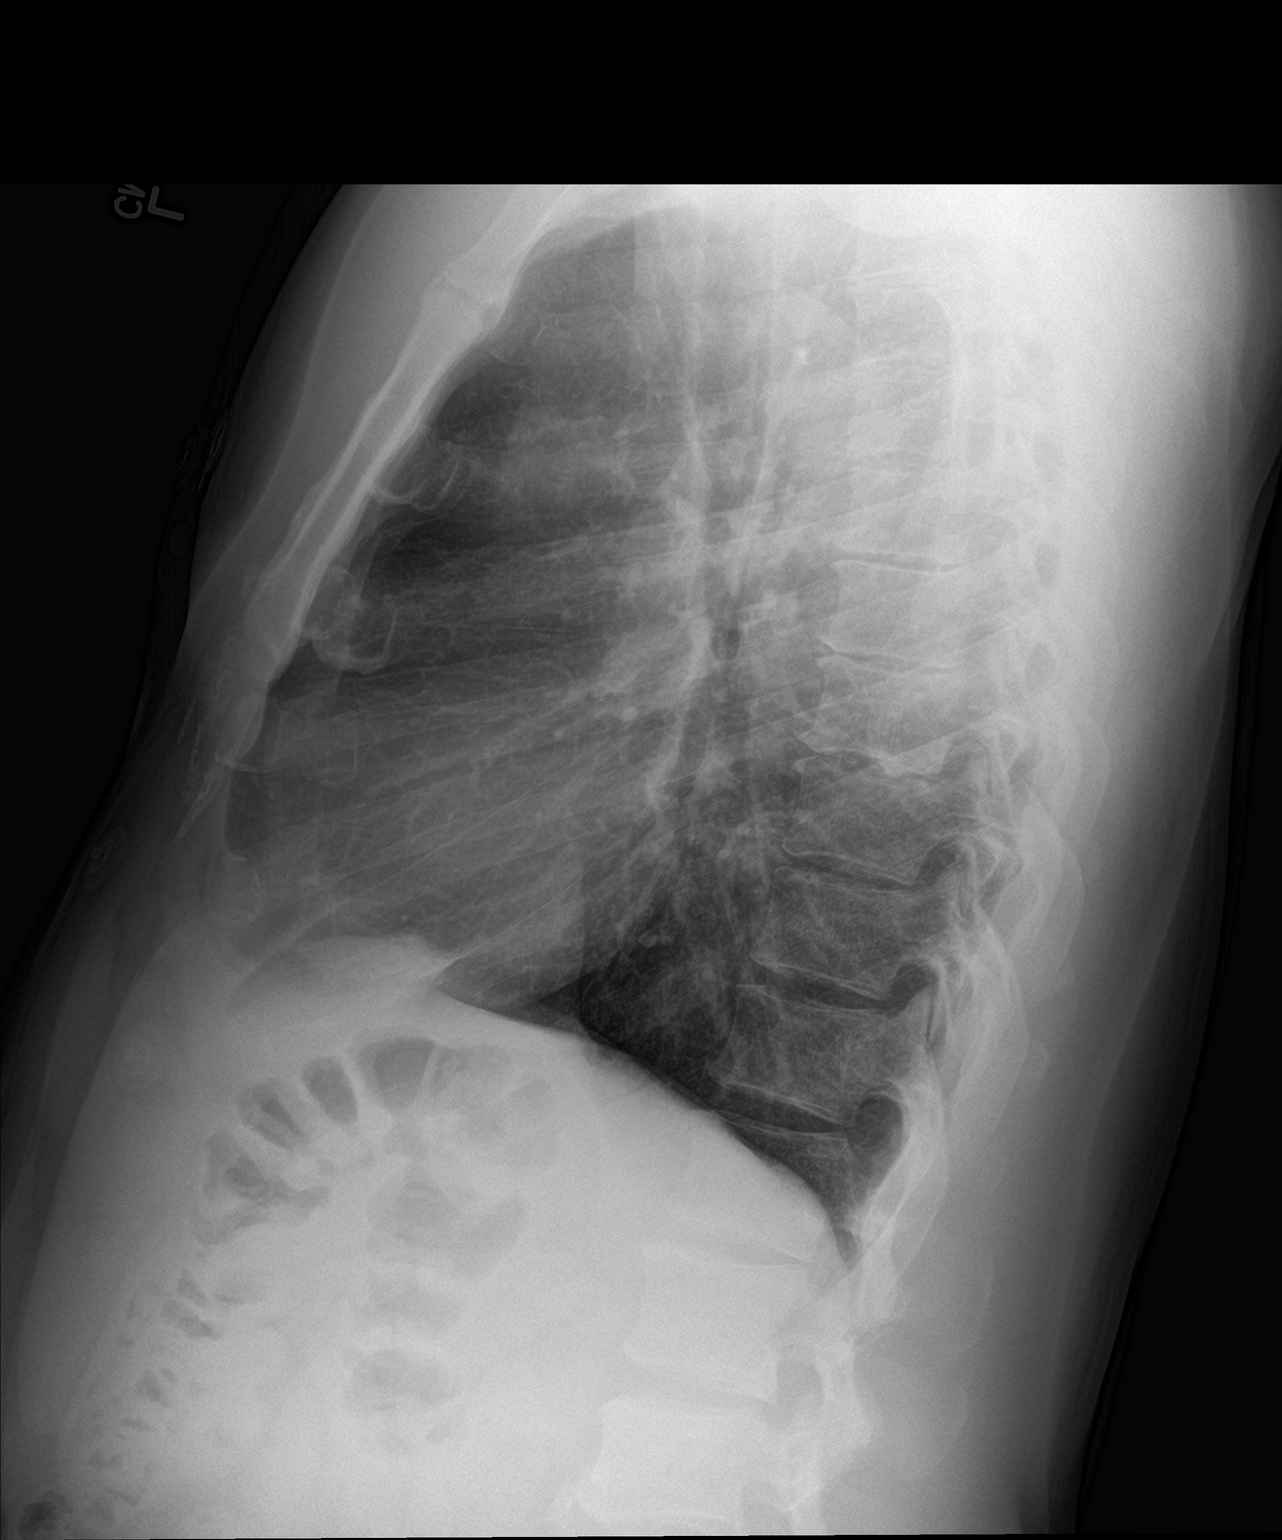

[2 of 2 positions shown; findings below may reference images not displayed]

FINDINGS: The heart size and mediastinal contours are within normal limits.
Both lungs are clear. The visualized skeletal structures are
unremarkable.
IMPRESSION: No active cardiopulmonary disease.

## 2024-07-15 ENCOUNTER — Other Ambulatory Visit: Payer: Self-pay

## 2024-07-15 ENCOUNTER — Encounter (HOSPITAL_BASED_OUTPATIENT_CLINIC_OR_DEPARTMENT_OTHER): Payer: Self-pay | Admitting: Emergency Medicine

## 2024-07-15 ENCOUNTER — Emergency Department (HOSPITAL_BASED_OUTPATIENT_CLINIC_OR_DEPARTMENT_OTHER)
Admission: EM | Admit: 2024-07-15 | Discharge: 2024-07-15 | Disposition: A | Discharge status: Home / Self Care | Attending: Emergency Medicine | Admitting: Emergency Medicine

## 2024-07-15 DIAGNOSIS — G8929 Other chronic pain: Secondary | ICD-10-CM | POA: Insufficient documentation

## 2024-07-15 DIAGNOSIS — M549 Dorsalgia, unspecified: Secondary | ICD-10-CM | POA: Diagnosis present

## 2024-07-15 DIAGNOSIS — M545 Low back pain, unspecified: Secondary | ICD-10-CM | POA: Diagnosis not present

## 2024-07-15 MED ORDER — IBUPROFEN 800 MG PO TABS
800.0000 mg | ORAL_TABLET | Freq: Once | ORAL | Status: AC
  Administered 2024-07-15: 800 mg via ORAL
  Filled 2024-07-15: qty 1

## 2024-07-15 MED ORDER — IBUPROFEN 800 MG PO TABS: 800.0000 mg | ORAL_TABLET | Freq: Three times a day (TID) | ORAL | 0 refills | Status: AC | PRN

## 2024-07-15 MED ORDER — DEXAMETHASONE SODIUM PHOSPHATE 4 MG/ML IJ SOLN
4.0000 mg | Freq: Once | INTRAMUSCULAR | Status: AC
  Administered 2024-07-15: 4 mg via INTRAMUSCULAR
  Filled 2024-07-15: qty 1

## 2024-07-15 NOTE — ED Triage Notes (Signed)
 Pt c/o lower back pain; has chronic back pain, but worse today, no injury

## 2024-07-15 NOTE — ED Provider Notes (Signed)
 " [place] [facility] Provider Note   CSN: [id number] Arrival date & time: 07/15/24  1900     Patient presents with: Back Pain   Timothy Franklin is a 58 y.o. male here presenting with back pain.  Patient has chronic back pain.  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  He did see a spine doctor and recommended surgery.  He states that he has not decided on surgery yet.  Patient states that he gets intermittent muscle spasms.  He states that for the last several days he has been having worsening back pain.  Patient states that he has no trauma or injury.  He states that the only thing that helps is steroids and ibuprofen .   The history is provided by the patient.       Prior to Admission medications  Medication Sig Start Date End Date Taking? Authorizing Provider  cyclobenzaprine  (FLEXERIL ) 10 MG tablet Take 1 tablet (10 mg total) by mouth 2 (two) times daily as needed for muscle spasms. 11/29/23   Jerrol Agent, MD  gabapentin  (NEURONTIN ) 300 MG capsule Take 1 capsule (300 mg total) by mouth 3 (three) times daily. Patient not taking: Reported on 08/05/2023 02/17/22   Chick Venetia BRAVO, MD  ibuprofen  (ADVIL ) 800 MG tablet Take 1 tablet (800 mg total) by mouth every 8 (eight) hours as needed. 07/15/24   Patt Alm Macho, MD  lidocaine  (LIDODERM ) 5 % Place 1 patch onto the skin daily. Remove & Discard patch within 12 hours or as directed by MD 08/08/23   Veta Palma, PA-C  naproxen  (NAPROSYN ) 375 MG tablet Take 1 tablet (375 mg total) by mouth 2 (two) times daily. Patient not taking: Reported on 08/05/2023 12/14/21   Hildegard Loge, PA-C  tadalafil (CIALIS) 5 MG tablet Take 5 mg by mouth daily as needed.    [provider]    Allergies: No known allergies    Review of Systems  Musculoskeletal:  Positive for back pain.  All other systems reviewed and are negative.   Updated Vital Signs BP (!) 132/90  (BP Location: Right Arm)   Pulse 74   Temp 98.2 F (36.8 C)   Resp 16   Ht 5' 8 (1.727 m)   Wt 88.5 kg   SpO2 98%   BMI 29.65 kg/m   Physical Exam Vitals and nursing note reviewed.  Constitutional:      Appearance: Normal appearance.  HENT:     Head: Normocephalic.     Nose: Nose normal.     Mouth/Throat:     Mouth: Mucous membranes are moist.  Eyes:     Extraocular Movements: Extraocular movements intact.     Pupils: Pupils are equal, round, and reactive to light.  Cardiovascular:     Rate and Rhythm: Normal rate and regular rhythm.     Pulses: Normal pulses.     Heart sounds: Normal heart sounds.  Pulmonary:     Effort: Pulmonary effort is normal.     Breath sounds: Normal breath sounds.  Abdominal:     General: Abdomen is flat.     Palpations: Abdomen is soft.  Musculoskeletal:     Cervical back: Normal range of motion and neck supple.     Comments: Mild left para lumbar tenderness.  No saddle anesthesia.  Normal strength bilateral arms and legs and normal gait  Skin:    General: Skin is warm.  Capillary Refill: Capillary refill takes less than 2 seconds.  Neurological:     Mental Status: He is alert.     Comments: No saddle anesthesia and normal gait  Psychiatric:        Mood and Affect: Mood normal.        Behavior: Behavior normal.     (all labs ordered are listed, but only abnormal results are displayed) Labs Reviewed - No data to display  EKG: None  Radiology: No results found.   Procedures   Medications Ordered in the ED  ibuprofen  (ADVIL ) tablet 800 mg (has no administration in time range)  dexamethasone  (DECADRON ) injection 4 mg (has no administration in time range)                                    Medical Decision Making London Tarnowski is a 58 y.o. male here presenting with back pain.  No red flags and no need for MRI currently.  This is a chronic issue.  Will give Decadron  shot and Motrin  for pain   Problems  Addressed: Chronic low back pain without sciatica, unspecified back pain laterality: chronic illness or injury  Risk Prescription drug management.     Final diagnoses:  None    ED Discharge Orders          Ordered    ibuprofen  (ADVIL ) 800 MG tablet  Every 8 hours PRN        07/15/24 2002               Patt Alm Macho, MD 07/15/24 2009  "

## 2024-07-15 NOTE — Discharge Instructions (Signed)
 As we discussed, you likely have chronic pain from arthritis  You were given a steroid shot.  I have prescribed ibuprofen  800 mg every 6 hours for pain  See your spine doctor for follow-up  Return to ER if you have worse back pain or trouble walking or weakness or numbness

## 2024-08-22 ENCOUNTER — Other Ambulatory Visit: Payer: Self-pay

## 2024-08-22 ENCOUNTER — Emergency Department (HOSPITAL_BASED_OUTPATIENT_CLINIC_OR_DEPARTMENT_OTHER)
Admission: EM | Admit: 2024-08-22 | Discharge: 2024-08-23 | Disposition: A | Discharge status: Home / Self Care | Attending: Emergency Medicine | Admitting: Emergency Medicine

## 2024-08-22 ENCOUNTER — Encounter (HOSPITAL_BASED_OUTPATIENT_CLINIC_OR_DEPARTMENT_OTHER): Payer: Self-pay

## 2024-08-22 DIAGNOSIS — L729 Follicular cyst of the skin and subcutaneous tissue, unspecified: Secondary | ICD-10-CM | POA: Insufficient documentation

## 2024-08-22 DIAGNOSIS — L089 Local infection of the skin and subcutaneous tissue, unspecified: Secondary | ICD-10-CM | POA: Insufficient documentation

## 2024-08-22 MED ORDER — SULFAMETHOXAZOLE-TRIMETHOPRIM 800-160 MG PO TABS: 1.0000 | ORAL_TABLET | Freq: Two times a day (BID) | ORAL | 0 refills | Status: AC

## 2024-08-22 MED ORDER — SULFAMETHOXAZOLE-TRIMETHOPRIM 800-160 MG PO TABS
1.0000 | ORAL_TABLET | Freq: Once | ORAL | Status: AC
  Administered 2024-08-22: 1 via ORAL
  Filled 2024-08-22: qty 1

## 2024-08-22 NOTE — ED Triage Notes (Signed)
 Pt presents via POV c/o abscess to back. Reports wife popped it the other day.

## 2024-08-22 NOTE — Discharge Instructions (Signed)
 I don't think you have an abscess but something similar to it. From your description the best course of treatment is to drain it.  However per our conversation we will trial a course of antibiotics with self drainage at home and see what kind of improvement you get. If you change your mind about drainage, return to your PCP/urgent care or here for repeat evaluation.

## 2024-08-23 NOTE — ED Provider Notes (Signed)
 " [place] [facility] Provider Note   CSN: [id number] Arrival date & time: 08/22/24  2210     Patient presents with: Abscess   Timothy Franklin is a 59 y.o. male.   59 year old male presents the ER today secondary to a bump on his back.  Patient states has been there for probably a year but seems to have become more bothersome recently.  Over the last week his wife has tried to drain it twice.  1 time she got black stuff and of the time she got some purulent drainage.  He presents here for further evaluation.  No fevers, nausea, vomiting, injuries.  He is unsure exactly how long it has been there.  It is not particularly uncomfortable until his wife expressed it and it was sore for a day.   Abscess      Prior to Admission medications  Medication Sig Start Date End Date Taking? Authorizing Provider  sulfamethoxazole -trimethoprim  (BACTRIM  DS) 800-160 MG tablet Take 1 tablet by mouth 2 (two) times daily for 7 days. 08/22/24 08/29/24 Yes Selmer Adduci, Selinda, MD  cyclobenzaprine  (FLEXERIL ) 10 MG tablet Take 1 tablet (10 mg total) by mouth 2 (two) times daily as needed for muscle spasms. 11/29/23   Jerrol Agent, MD  gabapentin  (NEURONTIN ) 300 MG capsule Take 1 capsule (300 mg total) by mouth 3 (three) times daily. Patient not taking: Reported on 08/05/2023 02/17/22   Chick Venetia BRAVO, MD  ibuprofen  (ADVIL ) 800 MG tablet Take 1 tablet (800 mg total) by mouth every 8 (eight) hours as needed. 07/15/24   Patt Alm Macho, MD  lidocaine  (LIDODERM ) 5 % Place 1 patch onto the skin daily. Remove & Discard patch within 12 hours or as directed by MD 08/08/23   Veta Palma, PA-C  naproxen  (NAPROSYN ) 375 MG tablet Take 1 tablet (375 mg total) by mouth 2 (two) times daily. Patient not taking: Reported on 08/05/2023 12/14/21   Hildegard Loge, PA-C  tadalafil (CIALIS) 5 MG tablet Take 5 mg by mouth daily as needed.    [provider]    Allergies: No known allergies     Review of Systems  Updated Vital Signs BP (!) 141/87 (BP Location: Right Arm)   Pulse (!) 53   Temp 98.3 F (36.8 C)   Resp 16   SpO2 97%   Physical Exam Vitals and nursing note reviewed.  Constitutional:      Appearance: He is well-developed.  HENT:     Head: Normocephalic and atraumatic.  Cardiovascular:     Rate and Rhythm: Normal rate.  Pulmonary:     Effort: Pulmonary effort is normal. No respiratory distress.  Abdominal:     General: There is no distension.  Musculoskeletal:        General: Normal range of motion.     Cervical back: Normal range of motion.  Skin:    Comments: Approximately 2 cm fluctuant area to his left upper back.  Minimal erythema over the fluctuant area.  Small scab associated with it.  No surrounding erythema, induration.  No tenderness to any area.  Bedside ultrasound with a small approximately 1 cm fluid collection and about half a centimeter deep.  Neurological:     Mental Status: He is alert.     (all labs ordered are listed, but only abnormal results are displayed) Labs Reviewed - No data to display  EKG: None  Radiology: No results found.   Procedures   Medications Ordered in the ED  sulfamethoxazole -trimethoprim  (BACTRIM  DS) 800-160 MG per tablet 1 tablet (1 tablet Oral Given 08/22/24 2353)                                    Medical Decision Making Risk Prescription drug management.   With the length of time is been there is probably an infected cyst since he said that there is purulent drainage.  I recommended incision and drainage to evaluate the fluid however patient was not keen on that recommendation.  He prefers to try a course of antibiotics and let his wife keep expressing it at home.  I discussed that this may not be enough he may need to come back or follow-up with a different doctor for the same.  I did discuss that if it was truly a cyst that was infected he probably would need to see a surgeon or dermatologist  for cyst removal and we did not really do that in the emergency room.  He expressed understanding.  Still preferred to try the antibiotics, warm compresses and self expression at home. No h/o kidney issues or allergies, bactrim  started.      Final diagnoses:  Infected cyst of skin    ED Discharge Orders          Ordered    sulfamethoxazole -trimethoprim  (BACTRIM  DS) 800-160 MG tablet  2 times daily        08/22/24 2339               Tika Hannis, Selinda, MD 08/23/24 [phone]  "
